# Patient Record
Sex: Female | Born: 1960 | Race: White | Hispanic: No | Marital: Married | Smoking: Never smoker
Health system: Southern US, Community
[De-identification: ages and names within clinical notes are randomized; demographics above are authoritative.]

## PROBLEM LIST (undated history)

## (undated) DIAGNOSIS — M1990 Unspecified osteoarthritis, unspecified site: Secondary | ICD-10-CM

## (undated) DIAGNOSIS — M069 Rheumatoid arthritis, unspecified: Secondary | ICD-10-CM

## (undated) DIAGNOSIS — I10 Essential (primary) hypertension: Secondary | ICD-10-CM

## (undated) HISTORY — DX: Rheumatoid arthritis, unspecified: M06.9

## (undated) HISTORY — DX: Essential (primary) hypertension: I10

## (undated) HISTORY — DX: Unspecified osteoarthritis, unspecified site: M19.90

## (undated) HISTORY — PX: ACHILLES TENDON REPAIR: SUR1153

---

## 2018-08-14 ENCOUNTER — Other Ambulatory Visit: Payer: Self-pay

## 2018-08-14 ENCOUNTER — Encounter: Payer: Self-pay | Admitting: Obstetrics and Gynecology

## 2018-08-14 ENCOUNTER — Other Ambulatory Visit (HOSPITAL_COMMUNITY)
Admission: RE | Admit: 2018-08-14 | Discharge: 2018-08-14 | Disposition: A | Discharge status: Home / Self Care | Payer: BLUE CROSS/BLUE SHIELD | Source: Ambulatory Visit | Attending: Obstetrics and Gynecology | Admitting: Obstetrics and Gynecology

## 2018-08-14 ENCOUNTER — Ambulatory Visit (INDEPENDENT_AMBULATORY_CARE_PROVIDER_SITE_OTHER): Payer: BLUE CROSS/BLUE SHIELD | Admitting: Obstetrics and Gynecology

## 2018-08-14 VITALS — BP 118/70 | HR 55 | Ht 66.75 in | Wt 177.0 lb

## 2018-08-14 DIAGNOSIS — Z87448 Personal history of other diseases of urinary system: Secondary | ICD-10-CM | POA: Diagnosis not present

## 2018-08-14 DIAGNOSIS — Z1151 Encounter for screening for human papillomavirus (HPV): Secondary | ICD-10-CM | POA: Insufficient documentation

## 2018-08-14 DIAGNOSIS — Z79899 Other long term (current) drug therapy: Secondary | ICD-10-CM | POA: Insufficient documentation

## 2018-08-14 DIAGNOSIS — I10 Essential (primary) hypertension: Secondary | ICD-10-CM | POA: Diagnosis not present

## 2018-08-14 DIAGNOSIS — N952 Postmenopausal atrophic vaginitis: Secondary | ICD-10-CM | POA: Diagnosis not present

## 2018-08-14 DIAGNOSIS — Z01419 Encounter for gynecological examination (general) (routine) without abnormal findings: Secondary | ICD-10-CM

## 2018-08-14 DIAGNOSIS — M069 Rheumatoid arthritis, unspecified: Secondary | ICD-10-CM | POA: Insufficient documentation

## 2018-08-14 LAB — POCT URINALYSIS DIPSTICK
BILIRUBIN UA: NEGATIVE
Glucose, UA: NEGATIVE
KETONES UA: NEGATIVE
NITRITE UA: NEGATIVE
Protein, UA: NEGATIVE
Urobilinogen, UA: 0.2 E.U./dL
pH, UA: 5 (ref 5.0–8.0)

## 2018-08-14 NOTE — Patient Instructions (Signed)

## 2018-08-14 NOTE — Progress Notes (Signed)
57 y.o. G57P2002 Married Caucasian female here for annual exam.    Pain with intercourse and vaginal dryness.  Tried vit E suppositories.  Tried vaginal estrogen cream but difficult to keep up. No prior HRT.  Mild hot flashes.   Has elevated cholesterol from [place].  Has RA.  Hx microscopic hematuria.   Moved to [place] one month ago.  Patient is a Engineer, civil (consulting).  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  Works in Insurance account manager.  Works from home.  Last child off to college now. Husband works for [employer].   Urine dip - trace leukocysts, trace blood.  PCP:   None  Patient's last menstrual period was 06/09/2015 (approximate).     Period Cycle (Days): (post menopausal)     Sexually active: Yes.    The current method of family planning is post menopausal status.    Exercising: Yes.    walking Smoker:  no  Health Maintenance: Pap:  2017 History of abnormal Pap:  no MMG:  2017 Colonoscopy:  Age 30, normal per patient BMD:   Age 73  Result  Normal per patient TDaP:  3 years ago Gardasil:   no HIV: unknown Hep C: unknown Screening Labs:  Will schedule with PCP Hb today: n/a, Urine today:    reports that she has never smoked. She has never used smokeless tobacco. She reports that she drinks alcohol. She reports that she has current or past drug history.  Past Medical History:  Diagnosis Date  . Hypertension   . Rheumatoid arthritis (HCC)    Past Surgical History: Achilles tendon debridement and bone spur removal.   Current Outpatient Medications  Medication Sig Dispense Refill  . carvedilol (COREG) 25 MG tablet Take 25 mg by mouth 2 (two) times daily.    . Coenzyme Q10 (CO Q 10 PO) Take by mouth daily.    . Ginkgo Biloba (GINKOBA PO) Take by mouth daily.    Marland Kitchen ibuprofen (ADVIL,MOTRIN) 200 MG tablet Take 200 mg by mouth as needed.    . Multiple Vitamins-Minerals (MULTIVITAMIN PO) Take by mouth daily.    Marland Kitchen olmesartan (BENICAR) 40 MG tablet Take 40 mg by mouth  daily.    . rosuvastatin (CRESTOR) 10 MG tablet Take 10 mg by mouth daily.    Marland Kitchen spironolactone (ALDACTONE) 25 MG tablet Take 25 mg by mouth daily.    . Tofacitinib Citrate (XELJANZ PO) Take 11 mg by mouth daily.    . TURMERIC PO Take by mouth daily.     No current facility-administered medications for this visit.     Family History  Problem Relation Age of Onset  . Thyroid disease Mother   . Glaucoma Father     Review of Systems  Constitutional: Negative.   HENT: Negative.   Eyes: Negative.   Respiratory: Negative.   Cardiovascular: Negative.   Gastrointestinal: Negative.   Endocrine: Negative.   Genitourinary:       Painful intercourse  Musculoskeletal: Negative.   Skin: Negative.   Allergic/Immunologic: Negative.   Neurological: Negative.   Hematological: Negative.   Psychiatric/Behavioral: Negative.   All other systems reviewed and are negative.   Exam:   BP 118/70   Pulse (!) 55   Ht 5' 6.75" (1.695 m)   Wt 177 lb (80.3 kg)   LMP 06/09/2015 (Approximate)   BMI 27.93 kg/m     General appearance: alert, cooperative and appears stated age Head: Normocephalic, without obvious abnormality, atraumatic Neck: no adenopathy, supple, symmetrical,  trachea midline and thyroid normal to inspection and palpation Lungs: clear to auscultation bilaterally Breasts: normal appearance, no masses or tenderness, No nipple retraction or dimpling, No nipple discharge or bleeding, No axillary or supraclavicular adenopathy Heart: regular rate and rhythm Abdomen: soft, non-tender; no masses, no organomegaly Extremities: extremities normal, atraumatic, no cyanosis or edema Skin: Skin color, texture, turgor normal. No rashes or lesions Lymph nodes: Cervical, supraclavicular, and axillary nodes normal. No abnormal inguinal nodes palpated Neurologic: Grossly normal  Pelvic: External genitalia:  no lesions              Urethra:  normal appearing urethra with no masses, tenderness or  lesions              Bartholins and Skenes: normal                 Vagina: normal appearing vagina with normal color and discharge, no lesions              Cervix: no lesions              Pap taken: Yes.   Bimanual Exam:  Uterus:  normal size, contour, position, consistency, mobility, non-tender              Adnexa: no mass, fullness, tenderness              Rectal exam: Yes.  .  Confirms.              Anus:  normal sphincter tone, no lesions  Chaperone was present for exam.  Assessment:   Well woman visit with normal exam. Vaginal atrophy. RA.  HTN.  Hx microscopic hematuria.   Plan: Mammogram screening.  She will schedule. Recommended self breast awareness. Pap and HR HPV as above. Guidelines for Calcium, Vitamin D, regular exercise program including cardiovascular and weight bearing exercise. Urine dip.  Estring Rx after mammogram back and normal.  I discussed potential effect on breast cancer.  List of PCPs to patient.  Labs with PCP. Follow up annually and prn.    After visit summary provided.

## 2018-08-17 ENCOUNTER — Other Ambulatory Visit: Payer: Self-pay | Admitting: Obstetrics and Gynecology

## 2018-08-17 DIAGNOSIS — Z1231 Encounter for screening mammogram for malignant neoplasm of breast: Secondary | ICD-10-CM

## 2018-08-18 LAB — CYTOLOGY - PAP
Diagnosis: NEGATIVE
HPV: NOT DETECTED

## 2018-08-21 ENCOUNTER — Telehealth: Payer: Self-pay

## 2018-08-21 NOTE — Telephone Encounter (Signed)
-----   Message from Patton Salles, MD sent at 08/20/2018 10:31 PM EDT ----- Pap and HR HPV negative.  Pap recall - 02.

## 2018-08-21 NOTE — Telephone Encounter (Signed)
Left detailed message for patient about results. 

## 2018-09-10 ENCOUNTER — Encounter: Payer: Self-pay | Admitting: Rheumatology

## 2018-09-10 ENCOUNTER — Ambulatory Visit (INDEPENDENT_AMBULATORY_CARE_PROVIDER_SITE_OTHER): Payer: Self-pay

## 2018-09-10 ENCOUNTER — Telehealth: Payer: Self-pay | Admitting: Pharmacy Technician

## 2018-09-10 ENCOUNTER — Ambulatory Visit (INDEPENDENT_AMBULATORY_CARE_PROVIDER_SITE_OTHER): Payer: BLUE CROSS/BLUE SHIELD | Admitting: Rheumatology

## 2018-09-10 VITALS — BP 128/84 | HR 54 | Resp 14 | Ht 66.5 in | Wt 180.0 lb

## 2018-09-10 DIAGNOSIS — Z79899 Other long term (current) drug therapy: Secondary | ICD-10-CM | POA: Diagnosis not present

## 2018-09-10 DIAGNOSIS — M0600 Rheumatoid arthritis without rheumatoid factor, unspecified site: Secondary | ICD-10-CM | POA: Diagnosis not present

## 2018-09-10 DIAGNOSIS — M25552 Pain in left hip: Secondary | ICD-10-CM

## 2018-09-10 DIAGNOSIS — M79671 Pain in right foot: Secondary | ICD-10-CM | POA: Diagnosis not present

## 2018-09-10 DIAGNOSIS — M79642 Pain in left hand: Secondary | ICD-10-CM | POA: Diagnosis not present

## 2018-09-10 DIAGNOSIS — G8929 Other chronic pain: Secondary | ICD-10-CM

## 2018-09-10 DIAGNOSIS — M25572 Pain in left ankle and joints of left foot: Secondary | ICD-10-CM

## 2018-09-10 DIAGNOSIS — R5383 Other fatigue: Secondary | ICD-10-CM | POA: Diagnosis not present

## 2018-09-10 DIAGNOSIS — M25571 Pain in right ankle and joints of right foot: Secondary | ICD-10-CM

## 2018-09-10 DIAGNOSIS — M25562 Pain in left knee: Secondary | ICD-10-CM

## 2018-09-10 DIAGNOSIS — I499 Cardiac arrhythmia, unspecified: Secondary | ICD-10-CM

## 2018-09-10 DIAGNOSIS — M25551 Pain in right hip: Secondary | ICD-10-CM

## 2018-09-10 DIAGNOSIS — M25561 Pain in right knee: Secondary | ICD-10-CM | POA: Diagnosis not present

## 2018-09-10 DIAGNOSIS — M79672 Pain in left foot: Secondary | ICD-10-CM

## 2018-09-10 DIAGNOSIS — M5136 Other intervertebral disc degeneration, lumbar region: Secondary | ICD-10-CM

## 2018-09-10 DIAGNOSIS — Z8679 Personal history of other diseases of the circulatory system: Secondary | ICD-10-CM

## 2018-09-10 DIAGNOSIS — M79641 Pain in right hand: Secondary | ICD-10-CM | POA: Diagnosis not present

## 2018-09-10 NOTE — Telephone Encounter (Signed)
Received a Prior Authorization request from [employer] for Xeljanz 11mg . Authorization has been submitted to patient's insurance via Cover My Meds. Will update once we receive a response.  Per insurance, patient's PA expired on 09/01/18. Her last refill was for 90 supply in July.  10:01 AM August, CPhT

## 2018-09-10 NOTE — Telephone Encounter (Signed)
Received notification from [facility] regarding a prior authorization for Xeljanz XR 11mg . Authorization has been APPROVED from 08/11/18 to 09/10/19.   Patient is approved to receive 90 day supplies.  Will send document to scan center.  Authorization # [id number] Phone # [phone]  Per plan, Patient must use [facility]. Please send in new prescription for 90 day supply.  10:17 AM [phone], CPhT

## 2018-09-10 NOTE — Progress Notes (Signed)
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   History of Present Illness: Tonya Strickland is a 57 y.o. female seen in consultation per request of Dr. Jinger Neighbors from [place].  According to patient her symptoms are started in her 62s with the intermittent knee joint pain, pain in her hands shoulders, hip joints, knee joints and feet.  She states she had several visits to orthopedic surgeons and no conclusion was made.  In 2015 she developed a painful Achilles tendon at the time she was seen by an orthopedic surgeon who felt that she had warmth over her ankle joint.  And referred her to Dr. Ronnald Ramp.  Dr. Ronnald Ramp did lab work and found that her ANA was positive advised to 160 per patient.  She states her rheumatoid factor was negative.  She was diagnosed with seronegative rheumatoid arthritis and was a started on Enbrel.  She states that Enbrel did not give much much relief.  After 3 months she was switched to Humira which she tried for 3 months without much relief.  She was switched to methotrexate which caused a lot of side effects including nausea high blood pressure and increased swelling.  Morrie Sheldon was added about 1-1/2-year ago.  She states after adding Morrie Sheldon her symptoms started getting better with less frequent flares.  She states she still has some ongoing pain and discomfort especially in her right knee joint.  Methotrexate was discontinued.  She is very stiff in the morning and walking helps.  She states if she takes Motrin.  In the spring 2019 she started experiencing hip pain and left SI joint pain.  She states she went to her physical therapist which helped to some extent.  At that time Dr. Ronnald Ramp obtain some x-rays and MRI.  The MRI  of the lumbar spine showed degenerative disc disease and mild central canal stenosis.  Activities of Daily Living:  Patient reports morning stiffness for 45 minutes.   Patient Reports nocturnal pain.  Difficulty dressing/grooming: Denies Difficulty climbing stairs: Reports Difficulty getting out of chair: Denies Difficulty using hands for taps, buttons, cutlery, and/or writing: Denies  Review of Systems  Constitutional: Negative for fatigue, night sweats, weight gain and weight loss.  HENT: Negative for mouth sores, trouble swallowing, trouble swallowing, mouth dryness and nose dryness.   Eyes: Negative for pain, redness, visual disturbance and dryness.  Respiratory: Negative for cough, shortness of breath and difficulty breathing.   Cardiovascular: Negative for chest pain, palpitations, hypertension, irregular heartbeat and swelling in legs/feet.  Gastrointestinal: Positive for constipation. Negative for blood in stool and diarrhea.  Endocrine: Negative for increased urination.  Genitourinary: Negative for vaginal dryness.  Musculoskeletal: Positive for arthralgias, joint pain, joint swelling and morning stiffness. Negative for myalgias, muscle weakness, muscle tenderness and myalgias.  Skin: Negative for color change, rash, hair loss, skin tightness, ulcers and sensitivity to sunlight.  Allergic/Immunologic: Negative for susceptible to infections.  Neurological: Negative for dizziness, memory loss, night sweats and weakness.  Hematological: Negative for swollen glands.  Psychiatric/Behavioral: Positive for sleep disturbance. Negative for depressed mood. The patient is not nervous/anxious.  PMFS History:  Patient Active Problem List   Diagnosis Date Noted  . Seronegative rheumatoid arthritis ([place]) 09/10/2018  . Irregular heart beat 09/10/2018  . History of hypertension 09/10/2018  . DDD (degenerative disc disease), lumbar 09/10/2018    Past Medical History:  Diagnosis Date  .  Hypertension   . Rheumatoid arthritis ([place])     Family History  Problem Relation Age of Onset  . Thyroid disease Mother        thyroid cancer   . Glaucoma Father   . Hypertension Father   . Neuropathy Father   . Arthritis Father   . Arthritis Brother   . Hypertension Brother   . Healthy Son   . Healthy Son    Past Surgical History:  Procedure Laterality Date  . ACHILLES TENDON REPAIR    . CESAREAN SECTION     Social History   Social History Narrative  . Not on file    Objective: Vital Signs: BP 128/84 (BP Location: Right Arm, Patient Position: Sitting, Cuff Size: Normal)   Pulse (!) 54   Resp 14   Ht 5' 6.5" (1.689 m)   Wt 180 lb (81.6 kg)   LMP 06/09/2015 (Approximate)   BMI 28.62 kg/m    Physical Exam  Constitutional: She is oriented to person, place, and time. She appears well-developed and well-nourished.  HENT:  Head: Normocephalic and atraumatic.  Eyes: Conjunctivae and EOM are normal.  Neck: Normal range of motion.  Cardiovascular: Normal rate, regular rhythm, normal heart sounds and intact distal pulses.  Pulmonary/Chest: Effort normal and breath sounds normal.  Abdominal: Soft. Bowel sounds are normal.  Lymphadenopathy:    She has no cervical adenopathy.  Neurological: She is alert and oriented to person, place, and time.  Skin: Skin is warm and dry. Capillary refill takes less than 2 seconds.  Psychiatric: She has a normal mood and affect. Her behavior is normal.  Nursing note and vitals reviewed.    Musculoskeletal Exam: Patient is stiffness and discomfort with range of motion of her cervical spine although she had good range of motion.  She is some discomfort range of motion of lumbar spine.  No SI joint tenderness was noted.  Shoulder joints were in good range of motion.  She has 10 degrees contractures in bilateral elbows without synovitis.  Her right wrist joint extension was 28 degrees and flexion 60 degrees.  Left wrist joint extension was 50  degrees and flexion 60 degrees.  She has synovial thickening over bilateral wrist joints, bilateral second and third MCP joints and right fifth MCP joint.  No synovitis was noted.  She had painful range of motion of bilateral hip joints.  She has thickening of bilateral knee joints and ankle joints without any synovitis.  She did not have any synovial thickening or synovitis of her MTP joints.  CDAI Exam: CDAI Score: 0.6  Patient Global Assessment: 3 (mm); Provider Global Assessment: 3 (mm) Swollen: 0 ; Tender: 0  Joint Exam   Not documented   There is currently no information documented on the homunculus. Go to the Rheumatology activity and complete the homunculus joint exam.  Investigation: Findings:  June 2019 CMP normal, CBC normal, anti-CCP negative, RF negative, ESR 2, CRP less than 0.2, TB Gold negative, fasting lipid panel LDL 58   Imaging: Xr Hips Bilat W Or W/o Pelvis 3-4 Views  Result Date: 09/10/2018 No hip joint narrowing was noted.  Some sclerosis was noted.  No SI joint narrowing was  noted.  Mild left SI joint to sclerosis was noted..  Some spurring was noted over the greater trochanter.  Xr Ankle 2 Views Left  Result Date: 09/10/2018 No tibiotalar joint space narrowing was noted.  Subtalar joint space narrowing was noted.  Posterior calcaneal spur was noted.  Xr Ankle 2 Views Right  Result Date: 09/10/2018 No tibiotalar joint space narrowing was noted.  Subtalar joint space narrowing was noted.  Some dorsal spurring was noted.  Xr Foot 2 Views Left  Result Date: 09/10/2018 First MTP, all PIP and DIP joint space narrowing was noted.  No erosive changes were noted.  No intertarsal joint space narrowing was noted. Impression: These findings are consistent with osteoarthritis of the foot.  Xr Foot 2 Views Right  Result Date: 09/10/2018 First MTP, all PIP and DIP joint space narrowing was noted.  No erosive changes were noted.  No intertarsal joint space narrowing was  noted. Impression: These findings are consistent with osteoarthritis of the foot.  Xr Hand 2 View Left  Result Date: 09/10/2018 Juxta-articular osteopenia was noted.  PIP narrowing was noted.  Mild intercarpal joint space narrowing was noted.  No erosive changes were noted. Impression: These findings are consistent with rheumatoid arthritis and osteoarthritis overlap.  Xr Hand 2 View Right  Result Date: 09/10/2018 Juxta-articular osteopenia was noted.  PIP and DIP narrowing was noted.  No significant MCP joint narrowing or erosive changes were noted.  Mild intercarpal joint space narrowing was noted. Impression: These findings are consistent with rheumatoid arthritis and osteoarthritis overlap.  Xr Knee 3 View Left  Result Date: 09/10/2018 Mild to moderate medial compartment narrowing was noted.  Intercondylar osteophytes were noted.  Severe patellofemoral narrowing was noted. 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  Xr Knee 3 View Right  Result Date: 09/10/2018 Mild to moderate medial compartment narrowing was noted.  Intercondylar osteophytes were noted.  Severe patellofemoral narrowing was noted. 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   Recent Labs: No results found for: WBC, HGB, PLT, NA, K, CL, CO2, GLUCOSE, BUN, CREATININE, BILITOT, ALKPHOS, AST, ALT, PROT, ALBUMIN, CALCIUM, GFRAA, QFTBGOLD, QFTBGOLDPLUS  Speciality Comments: No specialty comments available.  Procedures:  No procedures performed Allergies: Hctz [hydrochlorothiazide]   Assessment / Plan:     Visit Diagnoses: Seronegative rheumatoid arthritis ([place]) -patient started having symptoms in her 5s off inflammatory arthritis which was not treated for many years.  She is fairly well controlled on Morrie Sheldon now.  She has significant synovial thickening in multiple joints and  contractures but no active synovitis noted on examination today.  I will obtain following labs today.  I will also schedule ultrasound of her bilateral hands to look for active synovitis.- Plan: Urinalysis, Routine w reflex microscopic, 14-3-3 eta Protein.  Detailed counseling on Morrie Sheldon was provided.  Need for getting Shingrix vaccine was emphasized.  Handout was given and consent was taken today.  High risk medication use - xeljanz 11 mg p.o. daily, (MTX-discontinued due to side effects, previously tried Humira and Enbrel 2016)  - Plan: CBC with Differential/Platelet, COMPLETE METABOLIC PANEL WITH GFR, Hepatitis B core antibody, IgM, Hepatitis B surface antigen, Hepatitis C antibody, HIV Antibody (routine testing w rflx), Serum protein electrophoresis with reflex, IgG, IgA, IgM.  We will monitor CBC and CMP every 3 months, lipid panel on yearly basis and to be called on yearly basis.  Pain in both hands -she has synovial  thickening over bilateral wrist joints and several of her MCPs as described above.  She also has contractures in her bilateral elbows.  Plan: XR Hand 2 View Right, XR Hand 2 View Left.  No erosive changes were noted.  Acute pain of both hips -she had painful range of motion of bilateral hip joints.  Plan: XR HIPS BILAT W OR W/O PELVIS 3-4 VIEWS, hip joint x-rays were unremarkable.  Chronic pain of both knees -she has thickening of bilateral knee joints with limited extension.  Plan: XR KNEE 3 VIEW RIGHT, XR KNEE 3 VIEW LEFT.  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  Pain in both feet -she has thickening of bilateral ankle joints.  Tenderness across her MTPs.  Plan: XR Foot 2 Views Right, XR Foot 2 Views Left  Chronic pain of both ankles - Plan: XR Ankle 2 Views Right, XR Ankle 2 Views Left.  Mild subtalar joint space narrowing was noted. DDD (degenerative disc disease), lumbar -I reviewed MRI report of her lumbar spine.  L5-S1 degenerative  changes and mild central canal stenosis.  Irregular heart beat  History of hypertension  Other fatigue - Plan: CK, TSH   Orders: Orders Placed This Encounter  Procedures  . XR Hand 2 View Right  . XR Hand 2 View Left  . XR KNEE 3 VIEW RIGHT  . XR KNEE 3 VIEW LEFT  . XR Foot 2 Views Right  . XR Foot 2 Views Left  . XR Ankle 2 Views Right  . XR Ankle 2 Views Left  . XR HIPS BILAT W OR W/O PELVIS 3-4 VIEWS  . CBC with Differential/Platelet  . COMPLETE METABOLIC PANEL WITH GFR  . Urinalysis, Routine w reflex microscopic  . CK  . TSH  . 14-3-3 eta Protein  . Hepatitis B core antibody, IgM  . Hepatitis B surface antigen  . Hepatitis C antibody  . HIV Antibody (routine testing w rflx)  . Serum protein electrophoresis with reflex  . IgG, IgA, IgM   No orders of the defined types were placed in this encounter.   Face-to-face time spent with patient was 60 minutes. Greater than 50% of time was spent in counseling and coordination of care.  Follow-Up Instructions: Return for Rheumatoid arthritis.   Bo Merino, MD  Note - This record has been created using Editor, commissioning.  Chart creation errors have been sought, but may not always  have been located. Such creation errors do not reflect on  the standard of medical care.

## 2018-09-10 NOTE — Progress Notes (Signed)
Pharmacy Note  Subjective: Patient presents today to the [facility] to see Dr. Corliss Skains.  Patient seen by the pharmacist for counseling on Xeljanz.  Patient is relocating to the area and was on Xeljanz per her previous rheumatologist.  Objective: TB test: pending 09/10/18 Hepatitis: pending 09/10/18 Lipid panel: pending 09/10/18  CMP: pending  CBC: pending  Does patient have history of diverticulitis?  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  Counseled patient on purpose, proper use, and adverse effects of Xeljanz.   Reviewed the most common adverse effects including infection, diarrhea, headaches.  Also reviewed rare adverse effects such as bowel injury and the need to contact us if she develops stomach pain during treatment.  Reviewed with patient that there is the possibility of an increased risk of malignancy but it is not well understood if this increased risk is due to the medication or the disease state.  Counseled patient to avoid live vaccines while on [place].  Provided patient with medication education material and answered all questions.  Patient consented to [place].  Will upload Harriette Ohara into patient's chart.     Verlin Fester, PharmD, [facility] Rheumatology Clinical Pharmacist  09/10/2018 9:23 AM

## 2018-09-10 NOTE — Patient Instructions (Addendum)
Vaccines You are taking a medication(s) that can suppress your immune system.  The following immunizations are recommended: . Flu annually . Pneumonia . Shingrix . Hepatitis B  Please check with your PCP to make sure you are up to date.  Tofacitinib tablets What is this medicine? TOFACITINIB (TOE fa SYE ti nib) is a medicine that works on the immune system. This medicine is used to treat rheumatoid arthritis and psoriatic arthritis. This medicine may be used for other purposes; ask your health care provider or pharmacist if you have questions. COMMON BRAND NAME(S): Harriette Ohara What should I tell my health care provider before I take this medicine? They need to know if you have any of these conditions: -cancer -diabetes -high cholesterol -immune system problems -infection (especially a virus infection such as chickenpox, cold sores, or herpes) -kidney disease -liver disease -low blood counts, like low white cell, platelet, or red cell counts -stomach or intestine problems -an unusual or allergic reaction to tofacitinib, other medicines, foods, dyes, or preservatives -pregnant or trying to get pregnant -breast-feeding How should I use this medicine? Take this medicine by mouth with a glass of water. Follow the directions on the prescription label. You can take it with or without food. If it upsets your stomach, take it with food. A special MedGuide will be given to you by the pharmacist with each prescription and refill. Be sure to read this information carefully each time. Talk to your pediatrician regarding the use of this medicine in children. Special care may be needed. Overdosage: If you think you have taken too much of this medicine contact a poison control center or emergency room at once. NOTE: This medicine is only for you. Do not share this medicine with others. What if I miss a dose? If you miss a dose, take it as soon as you can. If it is almost time for your next dose, take  only that dose. Do not take double or extra doses. What may interact with this medicine? Do not take this medicine with any of the following medications: -azathioprine, cyclosporine, or other immunosuppressive drugs -biologic medicines for arthritis such as abatacept, adalimumab, anakinra, certolizumab, etanercept, golimumab, infliximab, rituximab, secukinumab, tocilizumab, ustekinumab -live vaccines This medicine may also interact with the following medications: -antiviral medicines for hepatitis, HIV or AIDS -certain medicines for fungal infections like fluconazole, itraconazole, ketoconazole, voriconazole -certain medicines for seizures like carbamazepine, phenobarbital, phenytoin -rifampin -supplements, such as St. John's wort This list may not describe all possible interactions. Give your health care provider a list of all the medicines, herbs, non-prescription drugs, or dietary supplements you use. Also tell them if you smoke, drink alcohol, or use illegal drugs. Some items may interact with your medicine. What should I watch for while using this medicine? Tell your doctor or healthcare professional if your symptoms do not start to get better or if they get worse. Avoid taking products that contain aspirin, acetaminophen, ibuprofen, naproxen, or ketoprofen unless instructed by your doctor. These medicines may hide a fever. Call your doctor or health care professional for advice if you get a fever, chills or sore throat, or other symptoms of a cold or flu. Do not treat yourself. This drug decreases your body's ability to fight infections. Try to avoid being around people who are sick. What side effects may I notice from receiving this medicine? Side effects that you should report to your doctor or health care professional as soon as possible: -allergic reactions like skin rash, itching or  hives, swelling of the face, lips, or tongue -breathing problems -dizziness -signs of infection -  fever or chills, cough, sore throat, pain or trouble passing urine -signs and symptoms of liver injury like dark yellow or brown urine; general ill feeling or flu-like symptoms; light-colored stools; loss of appetite; nausea; right upper belly pain; unusually weak or tired; yellowing of the eyes or skin -stomach pain or a sudden change in bowel habits -unusually weak or tired Side effects that usually do not require medical attention (report to your doctor or health care professional if they continue or are bothersome): -diarrhea -headache -muscle aches -runny nose -sinus trouble This list may not describe all possible side effects. Call your doctor for medical advice about side effects. You may report side effects to FDA at 1-800-FDA-1088. Where should I keep my medicine? Keep out of the reach of children. Store between 20 and 25 degrees C (68 and 77 degrees F). Throw away any unused medicine after the expiration date. NOTE: This sheet is a summary. It may not cover all possible information. If you have questions about this medicine, talk to your doctor, pharmacist, or health care provider.  2018 Elsevier/Gold Standard (2016-12-13 11:43:53) Standing Labs We placed an order today for your standing lab work.    Please come back and get your standing labs in January and every 3 months  We have open lab Monday through Friday from 8:30-11:30 AM and 1:30-4:00 PM  at the office of Dr. Pollyann Savoy.   You may experience shorter wait times on Monday and Friday afternoons. The office is located at [address], [address], [place], [place] [address] No appointment is necessary.   Labs are drawn by [facility].  You may receive a bill from [facility] for your lab work. If you have any questions regarding directions or hours of operation,  please call [phone].   Just as a reminder please drink plenty of water prior to coming for your lab work. Thanks!

## 2018-09-11 ENCOUNTER — Ambulatory Visit: Payer: Self-pay

## 2018-09-15 LAB — CBC WITH DIFFERENTIAL/PLATELET
BASOS PCT: 0.3 %
Basophils Absolute: 19 cells/uL (ref 0–200)
Eosinophils Absolute: 68 cells/uL (ref 15–500)
Eosinophils Relative: 1.1 %
HCT: 36.7 % (ref 35.0–45.0)
Hemoglobin: 12.1 g/dL (ref 11.7–15.5)
Lymphs Abs: 1755 cells/uL (ref 850–3900)
MCH: 30.1 pg (ref 27.0–33.0)
MCHC: 33 g/dL (ref 32.0–36.0)
MCV: 91.3 fL (ref 80.0–100.0)
MONOS PCT: 5.8 %
MPV: 10.7 fL (ref 7.5–12.5)
NEUTROS PCT: 64.5 %
Neutro Abs: 3999 cells/uL (ref 1500–7800)
Platelets: 230 10*3/uL (ref 140–400)
RBC: 4.02 10*6/uL (ref 3.80–5.10)
RDW: 12.8 % (ref 11.0–15.0)
TOTAL LYMPHOCYTE: 28.3 %
WBC: 6.2 10*3/uL (ref 3.8–10.8)
WBCMIX: 360 {cells}/uL (ref 200–950)

## 2018-09-15 LAB — URINALYSIS, ROUTINE W REFLEX MICROSCOPIC
BILIRUBIN URINE: NEGATIVE
Bacteria, UA: NONE SEEN /HPF
Glucose, UA: NEGATIVE
Hyaline Cast: NONE SEEN /LPF
Ketones, ur: NEGATIVE
Leukocytes, UA: NEGATIVE
Nitrite: NEGATIVE
Protein, ur: NEGATIVE
RBC / HPF: NONE SEEN /HPF (ref 0–2)
SPECIFIC GRAVITY, URINE: 1.012 (ref 1.001–1.03)
Squamous Epithelial / LPF: NONE SEEN /HPF (ref <=5)
WBC, UA: NONE SEEN /HPF (ref 0–5)
pH: 6 (ref 5.0–8.0)

## 2018-09-15 LAB — COMPLETE METABOLIC PANEL WITH GFR
AG RATIO: 2 (calc) (ref 1.0–2.5)
ALKALINE PHOSPHATASE (APISO): 35 U/L (ref 33–130)
ALT: 18 U/L (ref 6–29)
AST: 17 U/L (ref 10–35)
Albumin: 4.7 g/dL (ref 3.6–5.1)
BUN: 16 mg/dL (ref 7–25)
CALCIUM: 9.6 mg/dL (ref 8.6–10.4)
CO2: 24 mmol/L (ref 20–32)
Chloride: 102 mmol/L (ref 98–110)
Creat: 0.8 mg/dL (ref 0.50–1.05)
GFR, EST NON AFRICAN AMERICAN: 83 mL/min/{1.73_m2} (ref >=60)
GFR, Est African American: 96 mL/min/{1.73_m2} (ref >=60)
GLOBULIN: 2.3 g/dL (ref 1.9–3.7)
Glucose, Bld: 87 mg/dL (ref 65–99)
POTASSIUM: 4.4 mmol/L (ref 3.5–5.3)
SODIUM: 138 mmol/L (ref 135–146)
Total Bilirubin: 0.6 mg/dL (ref 0.2–1.2)
Total Protein: 7 g/dL (ref 6.1–8.1)

## 2018-09-15 LAB — TSH: TSH: 0.84 m[IU]/L

## 2018-09-15 LAB — HEPATITIS B CORE ANTIBODY, IGM: HEP B C IGM: NONREACTIVE

## 2018-09-15 LAB — IGG, IGA, IGM
IGG (IMMUNOGLOBIN G), SERUM: 840 mg/dL (ref 600–1640)
IGM, SERUM: 116 mg/dL (ref 50–300)
IMMUNOGLOBULIN A: 85 mg/dL (ref 47–310)

## 2018-09-15 LAB — HIV ANTIBODY (ROUTINE TESTING W REFLEX): HIV 1&2 Ab, 4th Generation: NONREACTIVE

## 2018-09-15 LAB — PROTEIN ELECTROPHORESIS, SERUM, WITH REFLEX
ALPHA 2: 0.7 g/dL (ref 0.5–0.9)
Albumin ELP: 4.5 g/dL (ref 3.8–4.8)
Alpha 1: 0.3 g/dL (ref 0.2–0.3)
BETA 2: 0.3 g/dL (ref 0.2–0.5)
BETA GLOBULIN: 0.5 g/dL (ref 0.4–0.6)
Gamma Globulin: 0.8 g/dL (ref 0.8–1.7)
Total Protein: 7 g/dL (ref 6.1–8.1)

## 2018-09-15 LAB — HEPATITIS C ANTIBODY
Hepatitis C Ab: NONREACTIVE
SIGNAL TO CUT-OFF: 0.01 (ref <1.00)

## 2018-09-15 LAB — IFE INTERPRETATION: IMMUNOFIX ELECTR INT: NOT DETECTED

## 2018-09-15 LAB — 14-3-3 ETA PROTEIN: 14-3-3 eta Protein: <0.2 ng/mL (ref <0.2)

## 2018-09-15 LAB — HEPATITIS B SURFACE ANTIGEN: HEP B S AG: NONREACTIVE

## 2018-09-15 LAB — CK: CK TOTAL: 51 U/L (ref 29–143)

## 2018-09-16 NOTE — Progress Notes (Signed)
WNLs

## 2018-09-21 ENCOUNTER — Other Ambulatory Visit: Payer: Self-pay | Admitting: Pharmacist

## 2018-09-21 ENCOUNTER — Other Ambulatory Visit: Payer: Self-pay | Admitting: Rheumatology

## 2018-09-21 MED ORDER — TOFACITINIB CITRATE ER 11 MG PO TB24: 11.0000 mg | ORAL_TABLET | Freq: Every day | ORAL | 0 refills | Status: DC

## 2018-09-21 NOTE — Telephone Encounter (Signed)
Prescription sent to the pharmacy this morning by [employer], Pharm D. Contacted patient to advise.

## 2018-09-21 NOTE — Progress Notes (Signed)
Labs returned negative or WNL.  Prescription sent to [facility] for Xeljanz XR.

## 2018-09-29 DIAGNOSIS — Z1389 Encounter for screening for other disorder: Secondary | ICD-10-CM | POA: Diagnosis not present

## 2018-09-29 DIAGNOSIS — E7849 Other hyperlipidemia: Secondary | ICD-10-CM | POA: Diagnosis not present

## 2018-09-29 DIAGNOSIS — M5126 Other intervertebral disc displacement, lumbar region: Secondary | ICD-10-CM | POA: Diagnosis not present

## 2018-09-29 DIAGNOSIS — I10 Essential (primary) hypertension: Secondary | ICD-10-CM | POA: Diagnosis not present

## 2018-09-29 DIAGNOSIS — M069 Rheumatoid arthritis, unspecified: Secondary | ICD-10-CM | POA: Diagnosis not present

## 2018-09-30 ENCOUNTER — Ambulatory Visit (INDEPENDENT_AMBULATORY_CARE_PROVIDER_SITE_OTHER): Payer: Self-pay

## 2018-09-30 ENCOUNTER — Ambulatory Visit (INDEPENDENT_AMBULATORY_CARE_PROVIDER_SITE_OTHER): Payer: BLUE CROSS/BLUE SHIELD | Admitting: Rheumatology

## 2018-09-30 DIAGNOSIS — M79642 Pain in left hand: Secondary | ICD-10-CM

## 2018-09-30 DIAGNOSIS — M79641 Pain in right hand: Secondary | ICD-10-CM | POA: Diagnosis not present

## 2018-10-09 DIAGNOSIS — M170 Bilateral primary osteoarthritis of knee: Secondary | ICD-10-CM | POA: Insufficient documentation

## 2018-10-09 DIAGNOSIS — M24522 Contracture, left elbow: Secondary | ICD-10-CM | POA: Insufficient documentation

## 2018-10-09 DIAGNOSIS — M24521 Contracture, right elbow: Secondary | ICD-10-CM | POA: Insufficient documentation

## 2018-10-09 NOTE — Progress Notes (Signed)
Office Visit Note  Patient: Tonya Strickland             Date of Birth: 07-Aug-1961           MRN: [id number]             PCP: Patient, No Pcp Per Referring: No ref. provider found Visit Date: 10/20/2018 Occupation: _0 @  Subjective:  Lower back pain and hip pain.   History of Present Illness: Tonya Strickland is a 57 y.o. female with history of seronegative rheumatoid arthritis, osteoarthritis and degenerative disease of lumbar spine.  She states she was diagnosed with L5 bulging disc.  She has been going for physical therapy but the pain persists.  She describes pain over bilateral trochanteric bursa.  She describes nocturnal pain while she sleeps on her side.  She denies any joint swelling.  She continues to have some discomfort in her knee joints.  Activities of Daily Living:  Patient reports morning stiffness for 1-2 hours.   Patient Reports nocturnal pain.  Difficulty dressing/grooming: Denies Difficulty climbing stairs: Denies Difficulty getting out of chair: Denies Difficulty using hands for taps, buttons, cutlery, and/or writing: Denies  Review of Systems  Constitutional: Negative for fatigue.  HENT: Negative for mouth dryness.   Eyes: Negative for dryness.  Respiratory: Negative for cough and shortness of breath.   Cardiovascular: Negative for chest pain, palpitations, hypertension and swelling in legs/feet.  Gastrointestinal: Negative for blood in stool, constipation and diarrhea.  Genitourinary: Negative for difficulty urinating.  Musculoskeletal: Positive for arthralgias, joint pain, joint swelling, myalgias, muscle weakness, morning stiffness and myalgias. Negative for muscle tenderness.  Skin: Negative for rash, hair loss and sensitivity to sunlight.  Neurological: Negative for dizziness, numbness, headaches and weakness.  Psychiatric/Behavioral: Positive for sleep disturbance. Negative for depressed mood.    PMFS History:  Patient Active Problem List   Diagnosis  Date Noted  . Primary osteoarthritis of both knees 10/09/2018  . Contracture of joint of both elbows 10/09/2018  . Seronegative rheumatoid arthritis ([place]) 09/10/2018  . Irregular heart beat 09/10/2018  . History of hypertension 09/10/2018  . DDD (degenerative disc disease), lumbar 09/10/2018    Past Medical History:  Diagnosis Date  . Hypertension   . Rheumatoid arthritis ([place])     Family History  Problem Relation Age of Onset  . Thyroid disease Mother        thyroid cancer   . Glaucoma Father   . Hypertension Father   . Neuropathy Father   . Arthritis Father   . Arthritis Brother   . Hypertension Brother   . Healthy Son   . Healthy Son   . Breast cancer Neg Hx    Past Surgical History:  Procedure Laterality Date  . ACHILLES TENDON REPAIR    . CESAREAN SECTION     Social History   Social History Narrative  . Not on file    Objective: Vital Signs: BP (!) 87/57 (BP Location: Left Arm, Patient Position: Sitting, Cuff Size: Normal)   Pulse 63   Ht 5' 7.25" (1.708 m)   Wt 182 lb 12.8 oz (82.9 kg)   LMP 06/09/2015 (Approximate)   BMI 28.42 kg/m    Physical Exam  Constitutional: She is oriented to person, place, and time. She appears well-developed and well-nourished.  HENT:  Head: Normocephalic and atraumatic.  Eyes: Conjunctivae and EOM are normal.  Neck: Normal range of motion.  Cardiovascular: Normal rate, regular rhythm, normal heart sounds and intact distal pulses.  Pulmonary/Chest:  Effort normal and breath sounds normal.  Abdominal: Soft. Bowel sounds are normal.  Lymphadenopathy:    She has no cervical adenopathy.  Neurological: She is alert and oriented to person, place, and time.  Skin: Skin is warm and dry. Capillary refill takes less than 2 seconds.  Psychiatric: She has a normal mood and affect. Her behavior is normal.  Nursing note and vitals reviewed.    Musculoskeletal Exam: C-spine thoracic spine good range of motion.  She has some discomfort  range of motion of her lumbar spine.  Shoulder joints elbow joints: Limited range of motion.  She has DIP and PIP thickening.  No MCP synovitis was noted.  Hip joints knee joints ankles MTPs PIPs been good range of motion with no synovitis.  She has synovial thickening over bilateral ankle joints.  CDAI Exam: CDAI Score: 0.2  Patient Global Assessment: 1 (mm); Provider Global Assessment: 1 (mm) Swollen: 0 ; Tender: 0  Joint Exam   Not documented   There is currently no information documented on the homunculus. Go to the Rheumatology activity and complete the homunculus joint exam.  Investigation: No additional findings.  Imaging: [place] Extrem Up Bilat Comp  Result Date: 09/30/2018 Ultrasound examination of bilateral hands was performed per EULAR recommendations. Using 12 MHz transducer, grayscale and power Doppler bilateral second, third, and fifth MCP joints and bilateral wrist joints both dorsal and volar aspects were evaluated to look for synovitis or tenosynovitis. The findings were there was no synovitis or tenosynovitis on ultrasound examination.  Synovial thickening was noted in right third and left second MCP joint.  Right median nerve was 0.10 cm squares which was within normal limits and left median nerve was 0.09 cm squares which was within normal limits. Impression: Ultrasound examination did not show any active synovitis.  Bilateral median nerves were within normal limits.   Recent Labs: Lab Results  Component Value Date   WBC 6.2 09/10/2018   HGB 12.1 09/10/2018   PLT 230 09/10/2018   NA 138 09/10/2018   K 4.4 09/10/2018   CL 102 09/10/2018   CO2 24 09/10/2018   GLUCOSE 87 09/10/2018   BUN 16 09/10/2018   CREATININE 0.80 09/10/2018   BILITOT 0.6 09/10/2018   AST 17 09/10/2018   ALT 18 09/10/2018   PROT 7.0 09/10/2018   PROT 7.0 09/10/2018   CALCIUM 9.6 09/10/2018   GFRAA 96 09/10/2018  UA negative, IFE negative, HIV negative, hepatitis B negative, hepatitis C-,  immunoglobulins normal, CK 51, TSH normal, _0 eta negative  June  2019 CMP normal, CBC normal, anti-CCP negative, RF negative, ESR 2, CRP less than 0.2, TB Gold negative, fasting lipid panel LDL 58  Speciality Comments: Immunospuressant Baseline Lab Work: TB gold negative June 2019  Procedures:  No procedures performed Allergies: Hctz [hydrochlorothiazide]   Assessment / Plan:     Visit Diagnoses: Seronegative rheumatoid arthritis ([place]) - RF negative, anti-CCP negative, _1 eta negative ESR normal.  Positive synovial thickening.  History of inflammatory arthritis in the past.  The ultrasound examination done recently was negative for synovitis.  She has no clinical synovitis on examination today.  High risk medication use - Xeljanz 11 mg p.o. daily(MTX-discontinued due to side effects, previously tried Humira and Enbrel 2016).  Her labs are stable.  We will continue to monitor labs every 3 months.  Contracture of joint of both elbows-chronic  Primary osteoarthritis of both knees - Bilateral moderate osteoarthritis and severe chondromalacia patella.  She  continues to have some discomfort in the joints but is tolerable.  DDD (degenerative disc disease), lumbar-she is going for physical therapy.  Chronic pain of both ankles - Synovial thickening  History of hypertension  Irregular heart beat  Trochanteric bursitis of both hips -she has more discomfort in her left trochanteric bursa than right.  She is going for physical therapy for her back.  Have given her prescription so she can get physical therapy for trochanteric bursitis.  Plan: Ambulatory referral to Physical Therapy   Orders: Orders Placed This Encounter  Procedures  . Ambulatory referral to Physical Therapy   No orders of the defined types were placed in this encounter.   Face-to-face time spent with patient was 30 minutes. Greater than 50% of time was spent in counseling and coordination of care.  Follow-Up  Instructions: Return in about 5 months (around 03/21/2019) for Rheumatoid arthritis, Osteoarthritis.   Bo Merino, MD  Note - This record has been created using Editor, commissioning.  Chart creation errors have been sought, but may not always  have been located. Such creation errors do not reflect on  the standard of medical care.

## 2018-10-16 ENCOUNTER — Ambulatory Visit
Admission: RE | Admit: 2018-10-16 | Discharge: 2018-10-16 | Disposition: A | Discharge status: Home / Self Care | Payer: BLUE CROSS/BLUE SHIELD | Source: Ambulatory Visit | Attending: Obstetrics and Gynecology | Admitting: Obstetrics and Gynecology

## 2018-10-16 DIAGNOSIS — Z1231 Encounter for screening mammogram for malignant neoplasm of breast: Secondary | ICD-10-CM | POA: Diagnosis not present

## 2018-10-19 DIAGNOSIS — M79604 Pain in right leg: Secondary | ICD-10-CM | POA: Diagnosis not present

## 2018-10-19 DIAGNOSIS — M545 Low back pain: Secondary | ICD-10-CM | POA: Diagnosis not present

## 2018-10-19 DIAGNOSIS — M5416 Radiculopathy, lumbar region: Secondary | ICD-10-CM | POA: Diagnosis not present

## 2018-10-19 DIAGNOSIS — M79605 Pain in left leg: Secondary | ICD-10-CM | POA: Diagnosis not present

## 2018-10-20 ENCOUNTER — Encounter: Payer: Self-pay | Admitting: Rheumatology

## 2018-10-20 ENCOUNTER — Ambulatory Visit (INDEPENDENT_AMBULATORY_CARE_PROVIDER_SITE_OTHER): Payer: BLUE CROSS/BLUE SHIELD | Admitting: Rheumatology

## 2018-10-20 VITALS — BP 87/57 | HR 63 | Ht 67.25 in | Wt 182.8 lb

## 2018-10-20 DIAGNOSIS — M24521 Contracture, right elbow: Secondary | ICD-10-CM | POA: Diagnosis not present

## 2018-10-20 DIAGNOSIS — M7062 Trochanteric bursitis, left hip: Secondary | ICD-10-CM

## 2018-10-20 DIAGNOSIS — Z79899 Other long term (current) drug therapy: Secondary | ICD-10-CM

## 2018-10-20 DIAGNOSIS — M0600 Rheumatoid arthritis without rheumatoid factor, unspecified site: Secondary | ICD-10-CM | POA: Diagnosis not present

## 2018-10-20 DIAGNOSIS — M170 Bilateral primary osteoarthritis of knee: Secondary | ICD-10-CM

## 2018-10-20 DIAGNOSIS — G8929 Other chronic pain: Secondary | ICD-10-CM

## 2018-10-20 DIAGNOSIS — M25572 Pain in left ankle and joints of left foot: Secondary | ICD-10-CM

## 2018-10-20 DIAGNOSIS — M7061 Trochanteric bursitis, right hip: Secondary | ICD-10-CM

## 2018-10-20 DIAGNOSIS — M25571 Pain in right ankle and joints of right foot: Secondary | ICD-10-CM

## 2018-10-20 DIAGNOSIS — M24522 Contracture, left elbow: Secondary | ICD-10-CM

## 2018-10-20 DIAGNOSIS — I499 Cardiac arrhythmia, unspecified: Secondary | ICD-10-CM

## 2018-10-20 DIAGNOSIS — M5136 Other intervertebral disc degeneration, lumbar region: Secondary | ICD-10-CM

## 2018-10-20 DIAGNOSIS — Z8679 Personal history of other diseases of the circulatory system: Secondary | ICD-10-CM

## 2018-10-20 NOTE — Patient Instructions (Addendum)
Iliotibial Band Syndrome Rehab Ask your health care provider which exercises are safe for you. Do exercises exactly as told by your health care provider and adjust them as directed. It is normal to feel mild stretching, pulling, tightness, or discomfort as you do these exercises, but you should stop right away if you feel sudden pain or your pain gets worse.Do not begin these exercises until told by your health care provider. Stretching and range of motion exercises These exercises warm up your muscles and joints and improve the movement and flexibility of your hip and pelvis. Exercise A: Quadriceps, prone  1. Lie on your abdomen on a firm surface, such as a bed or padded floor. 2. Bend your left / right knee and hold your ankle. If you cannot reach your ankle or pant leg, loop a belt around your foot and grab the belt instead. 3. Gently pull your heel toward your buttocks. Your knee should not slide out to the side. You should feel a stretch in the front of your thigh and knee. 4. Hold this position for __________ seconds. Repeat __________ times. Complete this stretch __________ times a day. Exercise B: Iliotibial band  1. Lie on your side with your left / right leg in the top position. 2. Bend both of your knees and grab your left / right ankle. Stretch out your bottom arm to help you balance. 3. Slowly bring your top knee back so your thigh goes behind your trunk. 4. Slowly lower your top leg toward the floor until you feel a gentle stretch on the outside of your left / right hip and thigh. If you do not feel a stretch and your knee will not fall farther, place the heel of your other foot on top of your knee and pull your knee down toward the floor with your foot. 5. Hold this position for __________ seconds. Repeat __________ times. Complete this stretch __________ times a day. Strengthening exercises These exercises build strength and endurance in your hip and pelvis. Endurance is the  ability to use your muscles for a long time, even after they get tired. Exercise C: Straight leg raises ( hip abductors) 1. Lie on your side with your left / right leg in the top position. Lie so your head, shoulder, knee, and hip line up. You may bend your bottom knee to help you balance. 2. Roll your hips slightly forward so your hips are stacked directly over each other and your left / right knee is facing forward. 3. Tense the muscles in your outer thigh and lift your top leg 4-6 inches (10-15 cm). 4. Hold this position for __________ seconds. 5. Slowly return to the starting position. Let your muscles relax completely before doing another repetition. Repeat __________ times. Complete this exercise __________ times a day. Exercise D: Straight leg raises ( hip extensors) 1. Lie on your abdomen on your bed or a firm surface. You can put a pillow under your hips if that is more comfortable. 2. Bend your left / right knee so your foot is straight up in the air. 3. Squeeze your buttock muscles and lift your left / right thigh off the bed. Do not let your back arch. 4. Tense this muscle as hard as you can without increasing any knee pain. 5. Hold this position for __________ seconds. 6. Slowly lower your leg to the starting position and allow it to relax completely. Repeat __________ times. Complete this exercise __________ times a day. Exercise E: Hip  hike 1. Stand sideways on a bottom step. Stand on your left / right leg with your other foot unsupported next to the step. You can hold onto the railing or wall if needed for balance. 2. Keep your knees straight and your torso square. Then, lift your left / right hip up toward the ceiling. 3. Slowly let your left / right hip lower toward the floor, past the starting position. Your foot should get closer to the floor. Do not lean or bend your knees. Repeat __________ times. Complete this exercise __________ times a day. This information is not  intended to replace advice given to you by your health care provider. Make sure you discuss any questions you have with your health care provider. Document Released: 11/25/2005 Document Revised: 07/30/2016 Document Reviewed: 10/27/2015 Elsevier Interactive Patient Education  2018 [employer].  [employer] We placed an order today for your standing lab work.    Please come back and get your standing labs in January and every 3 months We have open lab Monday through Friday from 8:30-11:30 AM and 1:30-4:00 PM  at the office of Dr. Pollyann Savoy.   You may experience shorter wait times on Monday and Friday afternoons. The office is located at [address], [address], [place], [place] [address] No appointment is necessary.   Labs are drawn by [facility].  You may receive a bill from [facility] for your lab work. If you have any questions regarding directions or hours of operation,  please call [phone].   Just as a reminder please drink plenty of water prior to coming for your lab work. Thanks!

## 2018-10-21 DIAGNOSIS — M79605 Pain in left leg: Secondary | ICD-10-CM | POA: Diagnosis not present

## 2018-10-21 DIAGNOSIS — M5416 Radiculopathy, lumbar region: Secondary | ICD-10-CM | POA: Diagnosis not present

## 2018-10-21 DIAGNOSIS — M79604 Pain in right leg: Secondary | ICD-10-CM | POA: Diagnosis not present

## 2018-10-21 DIAGNOSIS — M545 Low back pain: Secondary | ICD-10-CM | POA: Diagnosis not present

## 2018-10-26 DIAGNOSIS — M79604 Pain in right leg: Secondary | ICD-10-CM | POA: Diagnosis not present

## 2018-10-26 DIAGNOSIS — M79605 Pain in left leg: Secondary | ICD-10-CM | POA: Diagnosis not present

## 2018-10-26 DIAGNOSIS — M545 Low back pain: Secondary | ICD-10-CM | POA: Diagnosis not present

## 2018-10-26 DIAGNOSIS — M5416 Radiculopathy, lumbar region: Secondary | ICD-10-CM | POA: Diagnosis not present

## 2018-11-03 DIAGNOSIS — M5416 Radiculopathy, lumbar region: Secondary | ICD-10-CM | POA: Diagnosis not present

## 2018-11-03 DIAGNOSIS — M79605 Pain in left leg: Secondary | ICD-10-CM | POA: Diagnosis not present

## 2018-11-03 DIAGNOSIS — M545 Low back pain: Secondary | ICD-10-CM | POA: Diagnosis not present

## 2018-11-03 DIAGNOSIS — M79604 Pain in right leg: Secondary | ICD-10-CM | POA: Diagnosis not present

## 2018-11-16 DIAGNOSIS — M5416 Radiculopathy, lumbar region: Secondary | ICD-10-CM | POA: Diagnosis not present

## 2018-11-16 DIAGNOSIS — M545 Low back pain: Secondary | ICD-10-CM | POA: Diagnosis not present

## 2018-11-16 DIAGNOSIS — M79605 Pain in left leg: Secondary | ICD-10-CM | POA: Diagnosis not present

## 2018-11-16 DIAGNOSIS — M79604 Pain in right leg: Secondary | ICD-10-CM | POA: Diagnosis not present

## 2018-11-23 DIAGNOSIS — M79604 Pain in right leg: Secondary | ICD-10-CM | POA: Diagnosis not present

## 2018-11-23 DIAGNOSIS — M79605 Pain in left leg: Secondary | ICD-10-CM | POA: Diagnosis not present

## 2018-11-23 DIAGNOSIS — M545 Low back pain: Secondary | ICD-10-CM | POA: Diagnosis not present

## 2018-11-23 DIAGNOSIS — M5416 Radiculopathy, lumbar region: Secondary | ICD-10-CM | POA: Diagnosis not present

## 2018-11-30 DIAGNOSIS — M5416 Radiculopathy, lumbar region: Secondary | ICD-10-CM | POA: Diagnosis not present

## 2018-11-30 DIAGNOSIS — M79605 Pain in left leg: Secondary | ICD-10-CM | POA: Diagnosis not present

## 2018-11-30 DIAGNOSIS — M79604 Pain in right leg: Secondary | ICD-10-CM | POA: Diagnosis not present

## 2018-11-30 DIAGNOSIS — M545 Low back pain: Secondary | ICD-10-CM | POA: Diagnosis not present

## 2018-12-03 ENCOUNTER — Other Ambulatory Visit: Payer: Self-pay | Admitting: Rheumatology

## 2018-12-03 NOTE — Telephone Encounter (Signed)
Last Visit: 10/20/18 Next Visit: 03/23/19 Labs: 09/10/18 WNL  Okay to refill per Dr. Corliss Skains

## 2018-12-17 DIAGNOSIS — M79604 Pain in right leg: Secondary | ICD-10-CM | POA: Diagnosis not present

## 2018-12-17 DIAGNOSIS — M545 Low back pain: Secondary | ICD-10-CM | POA: Diagnosis not present

## 2018-12-17 DIAGNOSIS — M79605 Pain in left leg: Secondary | ICD-10-CM | POA: Diagnosis not present

## 2018-12-17 DIAGNOSIS — M5416 Radiculopathy, lumbar region: Secondary | ICD-10-CM | POA: Diagnosis not present

## 2018-12-30 DIAGNOSIS — J111 Influenza due to unidentified influenza virus with other respiratory manifestations: Secondary | ICD-10-CM | POA: Diagnosis not present

## 2018-12-30 DIAGNOSIS — M791 Myalgia, unspecified site: Secondary | ICD-10-CM | POA: Diagnosis not present

## 2018-12-30 DIAGNOSIS — I10 Essential (primary) hypertension: Secondary | ICD-10-CM | POA: Diagnosis not present

## 2019-02-09 ENCOUNTER — Encounter: Payer: Self-pay | Admitting: Rheumatology

## 2019-02-09 DIAGNOSIS — I10 Essential (primary) hypertension: Secondary | ICD-10-CM | POA: Diagnosis not present

## 2019-02-09 DIAGNOSIS — Z0000 Encounter for general adult medical examination without abnormal findings: Secondary | ICD-10-CM | POA: Diagnosis not present

## 2019-02-09 DIAGNOSIS — R82998 Other abnormal findings in urine: Secondary | ICD-10-CM | POA: Diagnosis not present

## 2019-02-16 DIAGNOSIS — Z79899 Other long term (current) drug therapy: Secondary | ICD-10-CM | POA: Diagnosis not present

## 2019-02-16 DIAGNOSIS — I10 Essential (primary) hypertension: Secondary | ICD-10-CM | POA: Diagnosis not present

## 2019-02-16 DIAGNOSIS — Z0000 Encounter for general adult medical examination without abnormal findings: Secondary | ICD-10-CM | POA: Diagnosis not present

## 2019-02-16 DIAGNOSIS — M069 Rheumatoid arthritis, unspecified: Secondary | ICD-10-CM | POA: Diagnosis not present

## 2019-02-16 DIAGNOSIS — E7849 Other hyperlipidemia: Secondary | ICD-10-CM | POA: Diagnosis not present

## 2019-02-24 DIAGNOSIS — J029 Acute pharyngitis, unspecified: Secondary | ICD-10-CM | POA: Diagnosis not present

## 2019-02-26 ENCOUNTER — Telehealth: Payer: Self-pay | Admitting: Rheumatology

## 2019-02-26 NOTE — Telephone Encounter (Signed)
Returned patient phone call.  Informed her that she is due for labs prior to sending in a refill.  She recently had her physical with labs form her PCP. She was given office fax number so PCP can fax results.  Will send Rx when we receive results.  All questions encouraged and answered.  Instructed patient to call with any further questions or concerns.  Verlin Fester, PharmD, [facility] Rheumatology Clinical Pharmacist  02/26/2019 3:53 PM

## 2019-02-26 NOTE — Telephone Encounter (Signed)
Patient called requesting prescription refill of Tonya Strickland to be sent to the specialty pharmacy.

## 2019-03-02 MED ORDER — TOFACITINIB CITRATE ER 11 MG PO TB24: 1.0000 | ORAL_TABLET | Freq: Every day | ORAL | 0 refills | Status: DC

## 2019-03-02 NOTE — Telephone Encounter (Signed)
Last Visit: 10/20/18 Next Visit: 03/23/19 Labs: 02/08/19 Sodium 134, Alk phos 35, RBC 4.1   Okay to refill per Dr. Estanislado Pandy

## 2019-03-15 ENCOUNTER — Other Ambulatory Visit: Payer: Self-pay | Admitting: Rheumatology

## 2019-03-15 MED ORDER — TOFACITINIB CITRATE ER 11 MG PO TB24: 1.0000 | ORAL_TABLET | Freq: Every day | ORAL | 0 refills | Status: DC

## 2019-03-15 NOTE — Telephone Encounter (Signed)
Last Visit: 10/20/18 Next Visit: 03/23/19 Labs: 02/2019 WNL  Okay to refill per Dr. Corliss Skains

## 2019-03-23 ENCOUNTER — Ambulatory Visit: Payer: BLUE CROSS/BLUE SHIELD | Admitting: Rheumatology

## 2019-06-29 ENCOUNTER — Other Ambulatory Visit: Payer: Self-pay | Admitting: Rheumatology

## 2019-06-29 NOTE — Telephone Encounter (Signed)
Left message to advise patient she is due for labs and a follow up visit.

## 2019-06-30 NOTE — Progress Notes (Signed)
Office Visit Note  Patient: Tonya Strickland             Date of Birth: 1961-04-24           MRN: [id number]             PCP: Patient, No Pcp Per Referring: No ref. provider found Visit Date: 07/01/2019 Occupation: @GUAROCC @  Subjective:  Pain in both knees   History of Present Illness: Tonya Strickland is a 58 y.o. female with history of seronegative rheumatoid arthritis, osteoarthritis, and DDD. She is on Xeljanz 11 mg po daily.  She states that Harriette Ohara has been very effective for her.  She states that she does have chronic bilateral knee pain.  She is intermittent swelling in both knees.  She states about 1 month ago she went on a long bike ride and had a long walk afterward which she feels worsened her knee joint pain.  She has been using ice, salon pause patches, and Motrin for pain relief.  She plans on following up at [facility].  She has intermittent pain and swelling in bilateral hands.  She has no discomfort at this time.  She has noticed some decreased grip strength.   Activities of Daily Living:  Patient reports morning stiffness for 1  hour.   Patient Reports nocturnal pain.  Difficulty dressing/grooming: Denies Difficulty climbing stairs: Reports Difficulty getting out of chair: Reports Difficulty using hands for taps, buttons, cutlery, and/or writing: Reports  Review of Systems  Constitutional: Negative for fatigue.  HENT: Negative for mouth sores, mouth dryness and nose dryness.   Eyes: Negative for pain, visual disturbance and dryness.  Respiratory: Negative for cough, hemoptysis, shortness of breath and difficulty breathing.   Cardiovascular: Negative for chest pain, palpitations, hypertension and swelling in legs/feet.  Gastrointestinal: Negative for blood in stool, constipation and diarrhea.  Endocrine: Negative for increased urination.  Genitourinary: Negative for painful urination.  Musculoskeletal: Positive for arthralgias, joint pain, joint swelling and  morning stiffness. Negative for myalgias, muscle weakness, muscle tenderness and myalgias.  Skin: Negative for color change, pallor, rash, hair loss, nodules/bumps, skin tightness, ulcers and sensitivity to sunlight.  Allergic/Immunologic: Negative for susceptible to infections.  Neurological: Negative for dizziness, numbness, headaches and weakness.  Hematological: Negative for swollen glands.  Psychiatric/Behavioral: Negative for depressed mood and sleep disturbance. The patient is not nervous/anxious.     PMFS History:  Patient Active Problem List   Diagnosis Date Noted  . Primary osteoarthritis of both knees 10/09/2018  . Contracture of joint of both elbows 10/09/2018  . Seronegative rheumatoid arthritis (HCC) 09/10/2018  . Irregular heart beat 09/10/2018  . History of hypertension 09/10/2018  . DDD (degenerative disc disease), lumbar 09/10/2018    Past Medical History:  Diagnosis Date  . Hypertension   . Rheumatoid arthritis (HCC)     Family History  Problem Relation Age of Onset  . Thyroid disease Mother        thyroid cancer   . Glaucoma Father   . Hypertension Father   . Neuropathy Father   . Arthritis Father   . Arthritis Brother   . Hypertension Brother   . Healthy Son   . Healthy Son   . Breast cancer Neg Hx    Past Surgical History:  Procedure Laterality Date  . ACHILLES TENDON REPAIR    . CESAREAN SECTION     Social History   Social History Narrative  . Not on file    There is no immunization  history on file for this patient.   Objective: Vital Signs: BP (!) 141/77 (BP Location: Right Arm, Patient Position: Sitting, Cuff Size: Large)   Pulse (!) 56   Resp 13   Ht 5\' 8"  (1.727 m)   Wt 186 lb 9.6 oz (84.6 kg)   LMP 06/09/2015 (Approximate)   BMI 28.37 kg/m    Physical Exam Vitals signs and nursing note reviewed.  Constitutional:      Appearance: She is well-developed.  HENT:     Head: Normocephalic and atraumatic.  Eyes:      Conjunctiva/sclera: Conjunctivae normal.  Neck:     Musculoskeletal: Normal range of motion.  Cardiovascular:     Rate and Rhythm: Normal rate and regular rhythm.     Heart sounds: Normal heart sounds.  Pulmonary:     Effort: Pulmonary effort is normal.     Breath sounds: Normal breath sounds.  Abdominal:     General: Bowel sounds are normal.     Palpations: Abdomen is soft.  Lymphadenopathy:     Cervical: No cervical adenopathy.  Skin:    General: Skin is warm and dry.     Capillary Refill: Capillary refill takes less than 2 seconds.  Neurological:     Mental Status: She is alert and oriented to person, place, and time.  Psychiatric:        Behavior: Behavior normal.      Musculoskeletal Exam: C-spine, thoracic spine, lumbar spine good range of motion.  No midline spinal tenderness.  No SI joint tenderness.  Shoulders joints, MCPs, PIPs, DIPs good range of motion no synovitis.  She has bilateral elbow joint contractures but no tenderness or inflammation was noted.  She has limited extension of bilateral wrist joints.  She has synovial thickening of bilateral wrist joints but no synovitis.  She has synovial thickening of bilateral second and third MCP joints.  She has complete fist formation bilaterally.  Hip joints, knee joints, ankle joints, MTPs and PIPs MTPs good range of motion no synovitis.  No warmth or effusion of bilateral knee joints.  She is synovial thickening of bilateral knee joints.  She has bilateral knee crepitus.  No tenderness of ankle joints.  She does have pedal edema bilaterally.  No tenderness over trochanteric bursa bilaterally.  CDAI Exam: CDAI Score: - Patient Global: -; Provider Global: - Swollen: -; Tender: - Joint Exam   No joint exam has been documented for this visit   There is currently no information documented on the homunculus. Go to the Rheumatology activity and complete the homunculus joint exam.  Investigation: No additional findings.   Imaging: No results found.  Recent Labs: Lab Results  Component Value Date   WBC 6.2 09/10/2018   HGB 12.1 09/10/2018   PLT 230 09/10/2018   NA 138 09/10/2018   K 4.4 09/10/2018   CL 102 09/10/2018   CO2 24 09/10/2018   GLUCOSE 87 09/10/2018   BUN 16 09/10/2018   CREATININE 0.80 09/10/2018   BILITOT 0.6 09/10/2018   AST 17 09/10/2018   ALT 18 09/10/2018   PROT 7.0 09/10/2018   PROT 7.0 09/10/2018   CALCIUM 9.6 09/10/2018   GFRAA 96 09/10/2018    Speciality Comments: Immunospuressant Baseline Lab Work: TB gold negative June 2019  Procedures:  No procedures performed Allergies: Hctz [hydrochlorothiazide]   Assessment / Plan:     Visit Diagnoses: Seronegative rheumatoid arthritis (HCC) - She has no synovitis on exam.  She has not had any recent rheumatoid  arthritis flares.  She is clinically doing well on Xeljanz 11 mg p.o. daily.  She has not missed any doses of Xeljanz recently.  She has chronic bilateral elbow joint contractures.  She has limited extension and synovial thickening of bilateral wrist joints.  She has no tenderness or synovitis on exam today.  She does have synovial thickening of bilateral second and third MCP joints.  She continues to have chronic pain in bilateral knee joints.  No warmth or effusion was noted on exam.  She synovial thickening and crepitus of bilateral knee joints.  She is planning on following up at [place] for further evaluation.  She will continue taking Xeljanz as prescribed.  A refill of Morrie Sheldon was sent to the pharmacy today.  She was also given a sample of Xeljanz until her prescription is delivered.  She was advised to notify us if develops increased joint pain or joint swelling.  She will follow-up in the office in 5 months.  High risk medication use - Xeljanz 11 mg p.o. daily, (MTX-discontinued due to side effects, previously tried Humira and Enbrel 2016) she was advised to hold Morrie Sheldon if she develops any signs or symptoms of  an infection and to resume once infection is completely cleared.  We discussed importance of social distancing and following standard precautions recommended by the CDC.  CBC and CMP are drawn today to monitor for drug toxicity.  She will return in October and every 3 months for lab work.  TB gold was also updated today.- Plan: CBC with Differential/Platelet, COMPLETE METABOLIC PANEL WITH GFR, QuantiFERON-TB Gold Plu  Contracture of joint of both elbows - She has bilateral elbow joint contractures-10 degree flexion contractures.  She has no tenderness or inflammation on exam today.  Primary osteoarthritis of both knees - Chronic pain.  She had x-rays of both knee joints performed on 09/10/2018 that revealed mild to moderate osteoarthritis and severe chondromalacia patella.  She has synovial thickening of both knee joints. She has good range of motion both knees with knee joint crepitus. No warmth or effusion was noted on exam today.  About 1 month ago she was biking and walking longer than usual and started having increased pain in both knee joints.  She has been using salonpas patches, taking Motrin, and applying ice for pain relief.  She is planning on following up at [place] for further evaluation.  Trochanteric bursitis of both hips - Resolved   DDD (degenerative disc disease), lumbar - She has intermittent lower back pain. She has no midline spinal tenderness.  She has good ROM with no discomfort.   History of hypertension   Irregular heart beat   Orders: Orders Placed This Encounter  Procedures  . CBC with Differential/Platelet  . COMPLETE METABOLIC PANEL WITH GFR  . QuantiFERON-TB Gold Plus   Meds ordered this encounter  Medications  . Tofacitinib Citrate ER (XELJANZ XR) 11 MG TB24    Sig: Take 1 tablet by mouth daily.    Dispense:  90 tablet    Refill:  0     Follow-Up Instructions: Return in about 5 months (around 12/01/2019) for Rheumatoid arthritis.   Ofilia Neas, PA-C  Note - This record has been created using Dragon software.  Chart creation errors have been sought, but may not always  have been located. Such creation errors do not reflect on  the standard of medical care.

## 2019-07-01 ENCOUNTER — Encounter: Payer: Self-pay | Admitting: Physician Assistant

## 2019-07-01 ENCOUNTER — Ambulatory Visit (INDEPENDENT_AMBULATORY_CARE_PROVIDER_SITE_OTHER): Payer: BC Managed Care – PPO | Admitting: Physician Assistant

## 2019-07-01 ENCOUNTER — Other Ambulatory Visit: Payer: Self-pay

## 2019-07-01 VITALS — BP 141/77 | HR 56 | Resp 13 | Ht 68.0 in | Wt 186.6 lb

## 2019-07-01 DIAGNOSIS — M170 Bilateral primary osteoarthritis of knee: Secondary | ICD-10-CM

## 2019-07-01 DIAGNOSIS — M0600 Rheumatoid arthritis without rheumatoid factor, unspecified site: Secondary | ICD-10-CM

## 2019-07-01 DIAGNOSIS — I499 Cardiac arrhythmia, unspecified: Secondary | ICD-10-CM

## 2019-07-01 DIAGNOSIS — M5136 Other intervertebral disc degeneration, lumbar region: Secondary | ICD-10-CM

## 2019-07-01 DIAGNOSIS — Z8679 Personal history of other diseases of the circulatory system: Secondary | ICD-10-CM

## 2019-07-01 DIAGNOSIS — M7062 Trochanteric bursitis, left hip: Secondary | ICD-10-CM

## 2019-07-01 DIAGNOSIS — M24522 Contracture, left elbow: Secondary | ICD-10-CM

## 2019-07-01 DIAGNOSIS — Z79899 Other long term (current) drug therapy: Secondary | ICD-10-CM

## 2019-07-01 DIAGNOSIS — M24521 Contracture, right elbow: Secondary | ICD-10-CM | POA: Diagnosis not present

## 2019-07-01 DIAGNOSIS — M7061 Trochanteric bursitis, right hip: Secondary | ICD-10-CM

## 2019-07-01 MED ORDER — XELJANZ XR 11 MG PO TB24: 1.0000 | ORAL_TABLET | Freq: Every day | ORAL | 0 refills | Status: DC

## 2019-07-01 NOTE — Progress Notes (Signed)
Medication Samples have been provided to the patient.  Drug name: Morrie Sheldon    Strength: 11 mg      Qty: 1  LOT: [id number]  Exp.Date: 03/2020  Dosing instructions: Take one tablet by mouth daily.   The patient has been instructed regarding the correct time, dose, and frequency of taking this medication, including desired effects and most common side effects.   Gwenlyn Perking 2:20 PM 07/01/2019

## 2019-07-01 NOTE — Patient Instructions (Addendum)
Hand Exercises Hand exercises can be helpful for almost anyone. These exercises can strengthen the hands, improve flexibility and movement, and increase blood flow to the hands. These results can make work and daily tasks easier. Hand exercises can be especially helpful for people who have joint pain from arthritis or have nerve damage from overuse (carpal tunnel syndrome). These exercises can also help people who have injured a hand. Exercises Most of these hand exercises are gentle stretching and motion exercises. It is usually safe to do them often throughout the day. Warming up your hands before exercise may help to reduce stiffness. You can do this with gentle massage or by placing your hands in warm water for 10-15 minutes. It is normal to feel some stretching, pulling, tightness, or mild discomfort as you begin new exercises. This will gradually improve. Stop an exercise right away if you feel sudden, severe pain or your pain gets worse. Ask your health care provider which exercises are best for you. Knuckle bend or "claw" fist 1. Stand or sit with your arm, hand, and all five fingers pointed straight up. Make sure to keep your wrist straight during the exercise. 2. Gently bend your fingers down toward your palm until the tips of your fingers are touching the top of your palm. Keep your big knuckle straight and just bend the small knuckles in your fingers. 3. Hold this position for __________ seconds. 4. Straighten (extend) your fingers back to the starting position. Repeat this exercise 5-10 times with each hand. Full finger fist 1. Stand or sit with your arm, hand, and all five fingers pointed straight up. Make sure to keep your wrist straight during the exercise. 2. Gently bend your fingers into your palm until the tips of your fingers are touching the middle of your palm. 3. Hold this position for __________ seconds. 4. Extend your fingers back to the starting position, stretching every  joint fully. Repeat this exercise 5-10 times with each hand. Straight fist 1. Stand or sit with your arm, hand, and all five fingers pointed straight up. Make sure to keep your wrist straight during the exercise. 2. Gently bend your fingers at the big knuckle, where your fingers meet your hand, and the middle knuckle. Keep the knuckle at the tips of your fingers straight and try to touch the bottom of your palm. 3. Hold this position for __________ seconds. 4. Extend your fingers back to the starting position, stretching every joint fully. Repeat this exercise 5-10 times with each hand. Tabletop 1. Stand or sit with your arm, hand, and all five fingers pointed straight up. Make sure to keep your wrist straight during the exercise. 2. Gently bend your fingers at the big knuckle, where your fingers meet your hand, as far down as you can while keeping the small knuckles in your fingers straight. Think of forming a tabletop with your fingers. 3. Hold this position for __________ seconds. 4. Extend your fingers back to the starting position, stretching every joint fully. Repeat this exercise 5-10 times with each hand. Finger spread 1. Place your hand flat on a table with your palm facing down. Make sure your wrist stays straight as you do this exercise. 2. Spread your fingers and thumb apart from each other as far as you can until you feel a gentle stretch. Hold this position for __________ seconds. 3. Bring your fingers and thumb tight together again. Hold this position for __________ seconds. Repeat this exercise 5-10 times with each hand.   Making circles 1. Stand or sit with your arm, hand, and all five fingers pointed straight up. Make sure to keep your wrist straight during the exercise. 2. Make a circle by touching the tip of your thumb to the tip of your index finger. 3. Hold for __________ seconds. Then open your hand wide. 4. Repeat this motion with your thumb and each finger on your hand.  Repeat this exercise 5-10 times with each hand. Thumb motion 1. Sit with your forearm resting on a table and your wrist straight. Your thumb should be facing up toward the ceiling. Keep your fingers relaxed as you move your thumb. 2. Lift your thumb up as high as you can toward the ceiling. Hold for __________ seconds. 3. Bend your thumb across your palm as far as you can, reaching the tip of your thumb for the small finger (pinkie) side of your palm. Hold for __________ seconds. Repeat this exercise 5-10 times with each hand. Grip strengthening  1. Hold a stress ball or other soft ball in the middle of your hand. 2. Slowly increase the pressure, squeezing the ball as much as you can without causing pain. Think of bringing the tips of your fingers into the middle of your palm. All of your finger joints should bend when doing this exercise. 3. Hold your squeeze for __________ seconds, then relax. Repeat this exercise 5-10 times with each hand. Contact a health care provider if:  Your hand pain or discomfort gets much worse when you do an exercise.  Your hand pain or discomfort does not improve within 2 hours after you exercise. If you have any of these problems, stop doing these exercises right away. Do not do them again unless your health care provider says that you can. Get help right away if:  You develop sudden, severe hand pain or swelling. If this happens, stop doing these exercises right away. Do not do them again unless your health care provider says that you can. This information is not intended to replace advice given to you by your health care provider. Make sure you discuss any questions you have with your health care provider. Document Released: 11/06/2015 Document Revised: 03/18/2019 Document Reviewed: 11/26/2018 Elsevier Patient Education  2020 [employer] We placed an order today for your standing lab work.    Please come back and get your standing  labs in October and every 3 months   We have open lab daily Monday through Thursday from 8:30-12:30 PM and 1:30-4:30 PM and Friday from 8:30-12:30 PM and 1:30 -4:00 PM at the office of Dr. Bo Merino.   You may experience shorter wait times on Monday and Friday afternoons. The office is located at [address], [place], [place], [place] [address] No appointment is necessary.   Labs are drawn by [facility].  You may receive a bill from [facility] for your lab work.  If you wish to have your labs drawn at another location, please call the office 24 hours in advance to send orders.  If you have any questions regarding directions or hours of operation,  please call [phone].   Just as a reminder please drink plenty of water prior to coming for your lab work. Thanks!

## 2019-07-02 NOTE — Progress Notes (Signed)
Hct borderline low.  Rest of CBC WNL.  Alk phos is borderline low.  Please add in phosphorous.   ALT is borderline elevated. She is taking Crestor.  Pease advise patient to avoid tylenol, NSAIDs, and alcohol.  Please forward labs to PCP.

## 2019-07-04 LAB — CBC WITH DIFFERENTIAL/PLATELET
Absolute Monocytes: 319 cells/uL (ref 200–950)
Basophils Absolute: 29 cells/uL (ref 0–200)
Basophils Relative: 0.6 %
Eosinophils Absolute: 49 cells/uL (ref 15–500)
Eosinophils Relative: 1 %
HCT: 34.5 % — ABNORMAL LOW (ref 35.0–45.0)
Hemoglobin: 11.9 g/dL (ref 11.7–15.5)
Lymphs Abs: 2254 cells/uL (ref 850–3900)
MCH: 30.7 pg (ref 27.0–33.0)
MCHC: 34.5 g/dL (ref 32.0–36.0)
MCV: 88.9 fL (ref 80.0–100.0)
MPV: 10.9 fL (ref 7.5–12.5)
Monocytes Relative: 6.5 %
Neutro Abs: 2249 cells/uL (ref 1500–7800)
Neutrophils Relative %: 45.9 %
Platelets: 226 10*3/uL (ref 140–400)
RBC: 3.88 10*6/uL (ref 3.80–5.10)
RDW: 13.1 % (ref 11.0–15.0)
Total Lymphocyte: 46 %
WBC: 4.9 10*3/uL (ref 3.8–10.8)

## 2019-07-04 LAB — COMPLETE METABOLIC PANEL WITH GFR
AG Ratio: 1.9 (calc) (ref 1.0–2.5)
ALT: 34 U/L — ABNORMAL HIGH (ref 6–29)
AST: 25 U/L (ref 10–35)
Albumin: 4.6 g/dL (ref 3.6–5.1)
Alkaline phosphatase (APISO): 32 U/L — ABNORMAL LOW (ref 37–153)
BUN: 13 mg/dL (ref 7–25)
CO2: 26 mmol/L (ref 20–32)
Calcium: 9.7 mg/dL (ref 8.6–10.4)
Chloride: 98 mmol/L (ref 98–110)
Creat: 0.7 mg/dL (ref 0.50–1.05)
GFR, Est African American: 113 mL/min/{1.73_m2} (ref >=60)
GFR, Est Non African American: 98 mL/min/{1.73_m2} (ref >=60)
Globulin: 2.4 g/dL (calc) (ref 1.9–3.7)
Glucose, Bld: 89 mg/dL (ref 65–99)
Potassium: 4.4 mmol/L (ref 3.5–5.3)
Sodium: 133 mmol/L — ABNORMAL LOW (ref 135–146)
Total Bilirubin: 0.6 mg/dL (ref 0.2–1.2)
Total Protein: 7 g/dL (ref 6.1–8.1)

## 2019-07-04 LAB — TEST AUTHORIZATION

## 2019-07-04 LAB — QUANTIFERON-TB GOLD PLUS
Mitogen-NIL: >10 IU/mL
NIL: 0.01 IU/mL
QuantiFERON-TB Gold Plus: NEGATIVE
TB1-NIL: 0 IU/mL
TB2-NIL: 0 IU/mL

## 2019-07-04 LAB — PHOSPHORUS: Phosphorus: 3.9 mg/dL (ref 2.5–4.5)

## 2019-07-05 NOTE — Progress Notes (Signed)
TB gold negative.  Phosphorus is WNL.

## 2019-08-27 ENCOUNTER — Ambulatory Visit: Payer: BLUE CROSS/BLUE SHIELD | Admitting: Obstetrics and Gynecology

## 2019-08-27 ENCOUNTER — Telehealth: Payer: Self-pay | Admitting: *Deleted

## 2019-08-27 NOTE — Telephone Encounter (Signed)
Faxed received stating prior authorization will expire soon.   Key: [id number]

## 2019-08-27 NOTE — Telephone Encounter (Signed)
Received notification from [facility] regarding a prior authorization for XELJANZ XR 11mg . Authorization has been APPROVED from 07/28/19 to 08/26/20.   Will send document to scan center.  Authorization # [id number]   1:45 PM Beatriz Chancellor, CPhT

## 2019-09-21 ENCOUNTER — Other Ambulatory Visit: Payer: Self-pay | Admitting: Physician Assistant

## 2019-09-21 NOTE — Telephone Encounter (Signed)
Last Visit: 07/01/19 Next Visit due December 2020. Message sent to the front to schedule  Labs: 07/01/19 Alk phos is borderline low. ALT is borderline elevated.  Okay to refill per Dr. Estanislado Pandy

## 2019-09-21 NOTE — Telephone Encounter (Signed)
Please schedule patient a follow up visit. Patient due December 2020. Thanks!  

## 2019-09-21 NOTE — Telephone Encounter (Signed)
I LMOM for patient to call, and schedule a follow up appointment in December with Dr. Estanislado Pandy.

## 2019-09-24 DIAGNOSIS — Z23 Encounter for immunization: Secondary | ICD-10-CM | POA: Diagnosis not present

## 2019-09-24 NOTE — Progress Notes (Signed)
Office Visit Note  Patient: Tonya Strickland             Date of Birth: [birth year]           MRN: [id number]             PCP: Patient, No Pcp Per Referring: No ref. provider found Visit Date: 10/04/2019 Occupation: @GUAROCC @  Subjective:  Pain in both hands    History of Present Illness: Tonya Strickland is a 58 y.o. female with history of seronegative rheumatoid arthritis, osteoarthritis, and DDD.  She is taking Xeljanz 11 mg XR daily.  She denies has not missed any doses recently.  She denies any recent infections.  She denies any recent rheumatoid arthritis flares.  She states that she has been having some increased pain in both hands.  She states at times she notices intermittent swelling.  She is also having more difficulty opening jars recently.  She continues to have chronic pain in both knee joints.  She states that the pain is exacerbated by high intensity activities.  She states she recently hiked 5 miles on rounds which cause discomfort the next day.  She states that she continues to walk for exercise on a regular basis.  She denies any other joint pain or joint swelling at this time.  She takes Advil very sparingly for pain relief.  She currently rates her pain a 3 out of 10.    Activities of Daily Living:  Patient reports morning stiffness for several hours.   Patient Reports nocturnal pain.  Difficulty dressing/grooming: Denies Difficulty climbing stairs: Denies Difficulty getting out of chair: Denies Difficulty using hands for taps, buttons, cutlery, and/or writing: Reports  Review of Systems  Constitutional: Negative for fatigue.  HENT: Negative for mouth sores, mouth dryness and nose dryness.   Eyes: Negative for pain, itching, visual disturbance and dryness.  Respiratory: Negative for cough, hemoptysis, shortness of breath, wheezing and difficulty breathing.   Cardiovascular: Negative for chest pain, palpitations, hypertension and swelling in legs/feet.  Gastrointestinal:  Negative for blood in stool, constipation and diarrhea.  Endocrine: Negative for increased urination.  Genitourinary: Negative for difficulty urinating and painful urination.  Musculoskeletal: Positive for arthralgias, joint pain, joint swelling and morning stiffness. Negative for myalgias, muscle weakness, muscle tenderness and myalgias.  Skin: Negative for color change, pallor, rash, hair loss, nodules/bumps, skin tightness, ulcers and sensitivity to sunlight.  Allergic/Immunologic: Negative for susceptible to infections.  Neurological: Negative for dizziness, light-headedness, numbness, headaches, memory loss and weakness.  Hematological: Negative for bruising/bleeding tendency and swollen glands.  Psychiatric/Behavioral: Positive for sleep disturbance. Negative for depressed mood and confusion. The patient is not nervous/anxious.     PMFS History:  Patient Active Problem List   Diagnosis Date Noted  . Primary osteoarthritis of both knees 10/09/2018  . Contracture of joint of both elbows 10/09/2018  . Seronegative rheumatoid arthritis (HCC) 09/10/2018  . Irregular heart beat 09/10/2018  . History of hypertension 09/10/2018  . DDD (degenerative disc disease), lumbar 09/10/2018    Past Medical History:  Diagnosis Date  . Hypertension   . Rheumatoid arthritis (HCC)     Family History  Problem Relation Age of Onset  . Thyroid disease Mother        thyroid cancer   . Glaucoma Father   . Hypertension Father   . Neuropathy Father   . Arthritis Father   . Arthritis Brother   . Hypertension Brother   . Healthy Son   . Healthy Son   .  Breast cancer Neg Hx    Past Surgical History:  Procedure Laterality Date  . ACHILLES TENDON REPAIR    . CESAREAN SECTION     Social History   Social History Narrative  . Not on file    There is no immunization history on file for this patient.   Objective: Vital Signs: BP 137/84 (BP Location: Left Arm, Patient Position: Sitting, Cuff  Size: Normal)   Pulse (!) 53   Resp 13   Ht 5\' 8"  (1.727 m)   Wt 184 lb 6.4 oz (83.6 kg)   LMP 06/09/2015 (Approximate)   BMI 28.04 kg/m    Physical Exam Vitals signs and nursing note reviewed.  Constitutional:      Appearance: She is well-developed.  HENT:     Head: Normocephalic and atraumatic.  Eyes:     Conjunctiva/sclera: Conjunctivae normal.  Neck:     Musculoskeletal: Normal range of motion.  Cardiovascular:     Rate and Rhythm: Normal rate and regular rhythm.     Heart sounds: Normal heart sounds.  Pulmonary:     Effort: Pulmonary effort is normal.     Breath sounds: Normal breath sounds.  Abdominal:     General: Bowel sounds are normal.     Palpations: Abdomen is soft.  Lymphadenopathy:     Cervical: No cervical adenopathy.  Skin:    General: Skin is warm and dry.     Capillary Refill: Capillary refill takes less than 2 seconds.  Neurological:     Mental Status: She is alert and oriented to person, place, and time.  Psychiatric:        Behavior: Behavior normal.      Musculoskeletal Exam: C-spine, thoracic spine, and lumbar spine good range of motion.  No midline spinal tenderness.  No SI joint tenderness.  Shoulder joints good range of motion with no discomfort.  Bilateral elbow joint contractures.   She has synovial thickening of bilateral wrist joints with limited extension.  MCPs, PIPs and DIPs good range of motion no synovitis.  Synovial thickening of bilateral second and third MCP joints.  Hip joints, knee joints and ankle joints of MTPs, PIPs and DIPs good range of motion no synovitis.  She has synovial thickening of bilateral knee joints.  Bilateral knee crepitus.  No warmth or effusion of knee joints noted.  No tenderness or swelling of ankle joints.  She does have some mild pedal edema bilaterally.  CDAI Exam: CDAI Score: 0.6  Patient Global: 3 mm; Provider Global: 3 mm Swollen: 0 ; Tender: 0  Joint Exam   No joint exam has been documented for this  visit   There is currently no information documented on the homunculus. Go to the Rheumatology activity and complete the homunculus joint exam.  Investigation: No additional findings.  Imaging: No results found.  Recent Labs: Lab Results  Component Value Date   WBC 4.9 07/01/2019   HGB 11.9 07/01/2019   PLT 226 07/01/2019   NA 133 (L) 07/01/2019   K 4.4 07/01/2019   CL 98 07/01/2019   CO2 26 07/01/2019   GLUCOSE 89 07/01/2019   BUN 13 07/01/2019   CREATININE 0.70 07/01/2019   BILITOT 0.6 07/01/2019   AST 25 07/01/2019   ALT 34 (H) 07/01/2019   PROT 7.0 07/01/2019   CALCIUM 9.7 07/01/2019   GFRAA 113 07/01/2019   QFTBGOLDPLUS NEGATIVE 07/01/2019    Speciality Comments: Immunospuressant Baseline Lab Work: TB gold negative June 2019  Procedures:  No  procedures performed Allergies: Hctz [hydrochlorothiazide]   Assessment / Plan:     Visit Diagnoses: Seronegative rheumatoid arthritis ([place]): She has no synovitis on exam today.  She has not had any recent rheumatoid arthritis flares.  She has been experiencing some increased discomfort and stiffness in both hands.  She has some difficulties opening jars at times.  She has synovial thickening and limited extension of bilateral wrist joints.  She has bilateral elbow joint contractures which are stable and unchanged.  She has chronic pain in both knee joints but has not noticed any increased inflammation. She has no warmth or effusion on exam today.  She has synovial thickening in both knee joints with bilateral knee crepitus.  Overall she is clinically doing well on Xeljanz 11 mg XR daily.  She has not missed any doses recently.  She has not had any recent infections.  She will continue on this current treatment regimen.  She is advised to notify us if she develops increased joint pain or joint swelling.  According to the patient she will be moving to [place] in February and may be transferring care at that time.  She would like to  have 1 more follow-up visit prior to the move, so she will schedule an office visit accordingly.  High risk medication use - Xeljanz XR 11 mg 1 tablet daily.  Last TB gold negative on 07/01/2019 and will monitor yearly.  Last lipid panel within normal limits on 02/09/2019 and will monitor yearly.  She is on Crestor 10 mg daily prescribed by her PCP. Most recent CBC/CMP within normal limits except for borderline elevated ALT on 07/01/2019.  Due for CBC/CMP today and will monitor every 3 months.   (MTX-discontinued due to side effects, previously tried Humira and Enbrel 2016) - Plan: CBC with Differential/Platelet, COMPLETE METABOLIC PANEL WITH GFR  Contracture of joint of both elbows:  Stable and unchanged bilateral elbow joint contractures.   Primary osteoarthritis of both knees: She has good range of motion of bilateral knee joints.  She is synovial thickening of both knees but no warmth or effusion was noted.  She has bilateral knee crepitus.  She experiences intermittent pain and swelling in both knees with high intensity activities peers.  She [place] which caused increased discomfort in both knee joints the following day.  She states she still been able to walk for exercise on a regular basis.  Trochanteric bursitis of both hips: Resolved.  She has no trochanteric bursa tenderness on exam today.  DDD (degenerative disc disease), lumbar: She has good range of motion with no discomfort.  She has no symptoms of radiculopathy.  Other medical conditions are listed as follows:   History of hypertension  Irregular heart beat  Orders: Orders Placed This Encounter  Procedures  . CBC with Differential/Platelet  . COMPLETE METABOLIC PANEL WITH GFR   No orders of the defined types were placed in this encounter.     Follow-Up Instructions: Return for Rheumatoid arthritis, Osteoarthritis, DDD.   Ofilia Neas, PA-C  Note - This record has been created using Dragon  software.  Chart creation errors have been sought, but may not always  have been located. Such creation errors do not reflect on  the standard of medical care.

## 2019-09-25 ENCOUNTER — Other Ambulatory Visit: Payer: Self-pay | Admitting: Physician Assistant

## 2019-10-04 ENCOUNTER — Ambulatory Visit (INDEPENDENT_AMBULATORY_CARE_PROVIDER_SITE_OTHER): Payer: BC Managed Care – PPO | Admitting: Physician Assistant

## 2019-10-04 ENCOUNTER — Encounter: Payer: Self-pay | Admitting: Physician Assistant

## 2019-10-04 ENCOUNTER — Other Ambulatory Visit: Payer: Self-pay

## 2019-10-04 VITALS — BP 137/84 | HR 53 | Resp 13 | Ht 68.0 in | Wt 184.4 lb

## 2019-10-04 DIAGNOSIS — M7061 Trochanteric bursitis, right hip: Secondary | ICD-10-CM

## 2019-10-04 DIAGNOSIS — I499 Cardiac arrhythmia, unspecified: Secondary | ICD-10-CM

## 2019-10-04 DIAGNOSIS — M24521 Contracture, right elbow: Secondary | ICD-10-CM

## 2019-10-04 DIAGNOSIS — Z79899 Other long term (current) drug therapy: Secondary | ICD-10-CM | POA: Diagnosis not present

## 2019-10-04 DIAGNOSIS — M7062 Trochanteric bursitis, left hip: Secondary | ICD-10-CM

## 2019-10-04 DIAGNOSIS — M170 Bilateral primary osteoarthritis of knee: Secondary | ICD-10-CM

## 2019-10-04 DIAGNOSIS — Z8679 Personal history of other diseases of the circulatory system: Secondary | ICD-10-CM

## 2019-10-04 DIAGNOSIS — M0600 Rheumatoid arthritis without rheumatoid factor, unspecified site: Secondary | ICD-10-CM

## 2019-10-04 DIAGNOSIS — M24522 Contracture, left elbow: Secondary | ICD-10-CM

## 2019-10-04 DIAGNOSIS — M5136 Other intervertebral disc degeneration, lumbar region: Secondary | ICD-10-CM

## 2019-10-05 LAB — CBC WITH DIFFERENTIAL/PLATELET
Absolute Monocytes: 299 cells/uL (ref 200–950)
Basophils Absolute: 29 cells/uL (ref 0–200)
Basophils Relative: 0.6 %
Eosinophils Absolute: 49 cells/uL (ref 15–500)
Eosinophils Relative: 1 %
HCT: 36.6 % (ref 35.0–45.0)
Hemoglobin: 12.1 g/dL (ref 11.7–15.5)
Lymphs Abs: 1593 cells/uL (ref 850–3900)
MCH: 29.5 pg (ref 27.0–33.0)
MCHC: 33.1 g/dL (ref 32.0–36.0)
MCV: 89.3 fL (ref 80.0–100.0)
MPV: 11 fL (ref 7.5–12.5)
Monocytes Relative: 6.1 %
Neutro Abs: 2930 cells/uL (ref 1500–7800)
Neutrophils Relative %: 59.8 %
Platelets: 236 10*3/uL (ref 140–400)
RBC: 4.1 10*6/uL (ref 3.80–5.10)
RDW: 12.8 % (ref 11.0–15.0)
Total Lymphocyte: 32.5 %
WBC: 4.9 10*3/uL (ref 3.8–10.8)

## 2019-10-05 LAB — COMPLETE METABOLIC PANEL WITH GFR
AG Ratio: 2.2 (calc) (ref 1.0–2.5)
ALT: 21 U/L (ref 6–29)
AST: 20 U/L (ref 10–35)
Albumin: 4.8 g/dL (ref 3.6–5.1)
Alkaline phosphatase (APISO): 35 U/L — ABNORMAL LOW (ref 37–153)
BUN: 11 mg/dL (ref 7–25)
CO2: 24 mmol/L (ref 20–32)
Calcium: 9.6 mg/dL (ref 8.6–10.4)
Chloride: 102 mmol/L (ref 98–110)
Creat: 0.8 mg/dL (ref 0.50–1.05)
GFR, Est African American: 96 mL/min/{1.73_m2} (ref >=60)
GFR, Est Non African American: 82 mL/min/{1.73_m2} (ref >=60)
Globulin: 2.2 g/dL (calc) (ref 1.9–3.7)
Glucose, Bld: 90 mg/dL (ref 65–99)
Potassium: 4.4 mmol/L (ref 3.5–5.3)
Sodium: 137 mmol/L (ref 135–146)
Total Bilirubin: 0.6 mg/dL (ref 0.2–1.2)
Total Protein: 7 g/dL (ref 6.1–8.1)

## 2019-10-05 NOTE — Progress Notes (Signed)
CBC WNL.  Alk phos is borderline low but trending up.  Rest of CMP WNL

## 2019-10-06 ENCOUNTER — Ambulatory Visit: Payer: BC Managed Care – PPO | Admitting: Obstetrics and Gynecology

## 2019-11-29 ENCOUNTER — Telehealth: Payer: Self-pay | Admitting: Rheumatology

## 2019-11-29 NOTE — Telephone Encounter (Signed)
Last Visit: 10/04/2019 Next Visit: 01/03/2020 Labs: 10/04/2019 CBC WNL. Alk phos is borderline low but trending up. Rest of CMP WNL. TB Gold: 07/01/2019 negative   Patient is resolving address issue with [facility].  Okay to provide sample. patient will come by the office this afternoon to pick up a sample.

## 2019-11-29 NOTE — Telephone Encounter (Signed)
Patient calling because [facility] sent her Rx to her old address in [place] by mistake. Patient request a sample of Xeljanz 11 mg to get her thru the holidays. Please call to advise.

## 2019-11-29 NOTE — Telephone Encounter (Signed)
Medication Samples have been provided to the patient.  Drug name: Morrie Sheldon   Strength: 11mg    Qty: 1 LOT: [id number]  Exp.Date: 03/2021  Dosing instructions: Take 1 tablet by mouth daily.   The patient has been instructed regarding the correct time, dose, and frequency of taking this medication, including desired effects and most common side effects.   Earnestine Mealing 3:19 PM 11/29/2019

## 2019-12-01 ENCOUNTER — Other Ambulatory Visit: Payer: Self-pay | Admitting: Obstetrics and Gynecology

## 2019-12-01 DIAGNOSIS — Z1231 Encounter for screening mammogram for malignant neoplasm of breast: Secondary | ICD-10-CM

## 2019-12-21 DIAGNOSIS — M1712 Unilateral primary osteoarthritis, left knee: Secondary | ICD-10-CM | POA: Diagnosis not present

## 2019-12-21 DIAGNOSIS — M1711 Unilateral primary osteoarthritis, right knee: Secondary | ICD-10-CM | POA: Diagnosis not present

## 2019-12-29 NOTE — Progress Notes (Signed)
Virtual Visit via Video Note  I connected with Tonya Strickland on 01/03/20 at  9:00 AM EST by a video enabled telemedicine application and verified that I am speaking with the correct person using two identifiers.  Location: Patient: Home Provider: Clinic  This service was conducted via virtual visit.  Both audio and visual tools were used.  The patient was located at home. I was located in my office.  Consent was obtained prior to the virtual visit and is aware of possible charges through their insurance for this visit.  The patient is an established patient.  Dr. Estanislado Pandy, MD conducted the virtual visit and Hazel Sams, PA-C acted as scribe during the service.  Office staff helped with scheduling follow up visits after the service was conducted.     I discussed the limitations of evaluation and management by telemedicine and the availability of in person appointments. The patient expressed understanding and agreed to proceed.  CC: Pain in both knee joints  History of Present Illness: Tonya Strickland is a 59 y.o. female with history of seronegative rheumatoid arthritis, osteoarthritis, and DDD.  She is taking Xeljanz 11 mg XR daily.  She denies any recent rheumatoid arthritis flares.  She has been experiencing increased pain in both knee joints.  She denies any joint swelling. She had recent cortisone injections at Dr. Damita Dunnings office, which were effective.  She had occasional discomfort in both hands.  She denies any elbow joint pain or inflammation. She denies any trochanteric bursitis discomfort currently. She experiences generalized joint stiffness in the evening as opposed to the morning.   Review of Systems  Constitutional: Negative for fever and malaise/fatigue.  HENT: Negative for congestion.   Eyes: Negative for photophobia, pain, discharge and redness.  Respiratory: Negative for cough, shortness of breath and wheezing.   Cardiovascular: Negative for chest pain, palpitations and leg  swelling.  Gastrointestinal: Negative for blood in stool, constipation and diarrhea.  Genitourinary: Negative for dysuria and urgency.  Musculoskeletal: Positive for joint pain. Negative for back pain, myalgias and neck pain.  Skin: Negative for rash.  Neurological: Negative for dizziness, weakness and headaches.  Endo/Heme/Allergies: Does not bruise/bleed easily.  Psychiatric/Behavioral: Negative for depression and memory loss. The patient is not nervous/anxious and does not have insomnia.      Observations/Objective:  Physical Exam  Constitutional: She is oriented to person, place, and time and well-developed, well-nourished, and in no distress.  HENT:  Head: Normocephalic and atraumatic.  Eyes: Conjunctivae are normal.  Pulmonary/Chest: Effort normal.  Neurological: She is alert and oriented to person, place, and time.  Psychiatric: Mood, memory, affect and judgment normal.     Patient reports morning stiffness for 0 NONE.   Patient reports nocturnal pain.  Difficulty dressing/grooming: Denies Difficulty climbing stairs: Denies Difficulty getting out of chair: Denies Difficulty using hands for taps, buttons, cutlery, and/or writing: Reports  Assessment and Plan: Visit Diagnoses: Seronegative rheumatoid arthritis ([place]): She has not had any recent rheumatoid arthritis flares. She has limited ROM of the right wrist joint and unchanged elbow joint contractures.  She is not having any joint inflammation or morning stiffness at this time.  She has been experiencing pain in both knee joints but no warmth or effusions.  She had recent cortisone injections in both knee joints performed by Dr. Mayer Camel at [facility], which alleviated her discomfort.  She is overall clinically doing well on Xeljanz 11 mg XR daily. She will continue on this current regimen.  She will be moving  to [place] in 2 weeks, so she will be establishing care in [place] and will need her records transferred.   She will follow up with [place] as needed in the meantime.   High risk medication use - Xeljanz XR 11 mg 1 tablet daily.  Last TB gold negative on 07/01/2019 and will monitor yearly.  CBC and CMP were drawn on 10/04/19.  She is due to update lab work.  Standing orders were placed today.  Future order for lipid panel placed today.    Contracture of joint of both elbows:  Stable and unchanged bilateral elbow joint contractures.   Primary osteoarthritis of both knees: She has been experiencing increased pain in both knee joints. She was recently evaluated by Dr. Turner Daniels and had cortisone injections in both knee joints, which has alleviated some discomfort. She previously had Synvisc injections and will be establishing care at an orthopedic office in [place] once she moves. She was encouraged to ride a stationary bike for exercise and continue to work on lower extremity muscle strengthening.    Trochanteric bursitis of both hips: Resolved. She has no discomfort at this time.   DDD (degenerative disc disease), lumbar: She has no lower back pain or stiffness at this time.   Other medical conditions are listed as follows:   History of hypertension  Irregular heart beat  Follow Up Instructions: She will follow up PRN. She will be transferring care to a practice in [place] once she moves in 2 weeks.    I discussed the assessment and treatment plan with the patient. The patient was provided an opportunity to ask questions and all were answered. The patient agreed with the plan and demonstrated an understanding of the instructions.   The patient was advised to call back or seek an in-person evaluation if the symptoms worsen or if the condition fails to improve as anticipated.  I provided 25 minutes of non-face-to-face time during this encounter.  Pollyann Savoy, MD    Scribed by-  Sherron Ales, PA-C

## 2020-01-03 ENCOUNTER — Telehealth (INDEPENDENT_AMBULATORY_CARE_PROVIDER_SITE_OTHER): Payer: BC Managed Care – PPO | Admitting: Rheumatology

## 2020-01-03 ENCOUNTER — Other Ambulatory Visit: Payer: Self-pay | Admitting: *Deleted

## 2020-01-03 ENCOUNTER — Telehealth: Payer: Self-pay | Admitting: Rheumatology

## 2020-01-03 ENCOUNTER — Encounter: Payer: Self-pay | Admitting: Rheumatology

## 2020-01-03 ENCOUNTER — Other Ambulatory Visit: Payer: Self-pay

## 2020-01-03 DIAGNOSIS — M24522 Contracture, left elbow: Secondary | ICD-10-CM

## 2020-01-03 DIAGNOSIS — I499 Cardiac arrhythmia, unspecified: Secondary | ICD-10-CM

## 2020-01-03 DIAGNOSIS — M24521 Contracture, right elbow: Secondary | ICD-10-CM | POA: Diagnosis not present

## 2020-01-03 DIAGNOSIS — Z79899 Other long term (current) drug therapy: Secondary | ICD-10-CM | POA: Diagnosis not present

## 2020-01-03 DIAGNOSIS — M170 Bilateral primary osteoarthritis of knee: Secondary | ICD-10-CM

## 2020-01-03 DIAGNOSIS — M5136 Other intervertebral disc degeneration, lumbar region: Secondary | ICD-10-CM

## 2020-01-03 DIAGNOSIS — M0600 Rheumatoid arthritis without rheumatoid factor, unspecified site: Secondary | ICD-10-CM

## 2020-01-03 DIAGNOSIS — M7062 Trochanteric bursitis, left hip: Secondary | ICD-10-CM

## 2020-01-03 DIAGNOSIS — M7061 Trochanteric bursitis, right hip: Secondary | ICD-10-CM

## 2020-01-03 DIAGNOSIS — Z8679 Personal history of other diseases of the circulatory system: Secondary | ICD-10-CM

## 2020-01-03 NOTE — Telephone Encounter (Signed)
Patient left a voicemail stating she had a virtual appointment with Dr. Corliss Skains this morning.  Patient states she is arranging for a Rheumatologist in [place] for when she moves and Dr. Corliss Skains recommended Dr. Jacquenette Shone.  Patient states she called their office and they need a referral with [facility].  Please fax to their office #[phone]

## 2020-01-03 NOTE — Telephone Encounter (Signed)
Referral, notes and labs faxed, pending appt

## 2020-01-04 ENCOUNTER — Telehealth: Payer: Self-pay | Admitting: Rheumatology

## 2020-01-04 ENCOUNTER — Other Ambulatory Visit: Payer: Self-pay | Admitting: *Deleted

## 2020-01-04 DIAGNOSIS — Z79899 Other long term (current) drug therapy: Secondary | ICD-10-CM

## 2020-01-04 NOTE — Telephone Encounter (Signed)
Lab Orders released.  

## 2020-01-04 NOTE — Telephone Encounter (Signed)
Patient had labs drawn at [facility] at 7:15am. Please release orders.

## 2020-01-05 ENCOUNTER — Telehealth: Payer: Self-pay | Admitting: *Deleted

## 2020-01-05 ENCOUNTER — Other Ambulatory Visit: Payer: Self-pay | Admitting: Rheumatology

## 2020-01-05 DIAGNOSIS — D638 Anemia in other chronic diseases classified elsewhere: Secondary | ICD-10-CM

## 2020-01-05 LAB — COMPLETE METABOLIC PANEL WITH GFR
AG Ratio: 1.8 (calc) (ref 1.0–2.5)
ALT: 19 U/L (ref 6–29)
AST: 17 U/L (ref 10–35)
Albumin: 4.2 g/dL (ref 3.6–5.1)
Alkaline phosphatase (APISO): 32 U/L — ABNORMAL LOW (ref 37–153)
BUN: 16 mg/dL (ref 7–25)
CO2: 25 mmol/L (ref 20–32)
Calcium: 9.5 mg/dL (ref 8.6–10.4)
Chloride: 100 mmol/L (ref 98–110)
Creat: 0.83 mg/dL (ref 0.50–1.05)
GFR, Est African American: 91 mL/min/{1.73_m2} (ref >=60)
GFR, Est Non African American: 79 mL/min/{1.73_m2} (ref >=60)
Globulin: 2.4 g/dL (calc) (ref 1.9–3.7)
Glucose, Bld: 89 mg/dL (ref 65–99)
Potassium: 4.6 mmol/L (ref 3.5–5.3)
Sodium: 137 mmol/L (ref 135–146)
Total Bilirubin: 0.5 mg/dL (ref 0.2–1.2)
Total Protein: 6.6 g/dL (ref 6.1–8.1)

## 2020-01-05 LAB — CBC WITH DIFFERENTIAL/PLATELET
Absolute Monocytes: 311 cells/uL (ref 200–950)
Basophils Absolute: 31 cells/uL (ref 0–200)
Basophils Relative: 0.5 %
Eosinophils Absolute: 61 cells/uL (ref 15–500)
Eosinophils Relative: 1 %
HCT: 30.4 % — ABNORMAL LOW (ref 35.0–45.0)
Hemoglobin: 9.9 g/dL — ABNORMAL LOW (ref 11.7–15.5)
Lymphs Abs: 2367 cells/uL (ref 850–3900)
MCH: 29.6 pg (ref 27.0–33.0)
MCHC: 32.6 g/dL (ref 32.0–36.0)
MCV: 90.7 fL (ref 80.0–100.0)
MPV: 12.2 fL (ref 7.5–12.5)
Monocytes Relative: 5.1 %
Neutro Abs: 3331 cells/uL (ref 1500–7800)
Neutrophils Relative %: 54.6 %
Platelets: 220 10*3/uL (ref 140–400)
RBC: 3.35 10*6/uL — ABNORMAL LOW (ref 3.80–5.10)
RDW: 13.2 % (ref 11.0–15.0)
Total Lymphocyte: 38.8 %
WBC: 6.1 10*3/uL (ref 3.8–10.8)

## 2020-01-05 LAB — LIPID PANEL
Cholesterol: 181 mg/dL (ref <200)
HDL: 71 mg/dL (ref >=50)
LDL Cholesterol (Calc): 84 mg/dL (calc)
Non-HDL Cholesterol (Calc): 110 mg/dL (calc) (ref <130)
Total CHOL/HDL Ratio: 2.5 (calc) (ref <5.0)
Triglycerides: 160 mg/dL — ABNORMAL HIGH (ref <150)

## 2020-01-05 MED ORDER — XELJANZ XR 11 MG PO TB24: 1.0000 | ORAL_TABLET | Freq: Every day | ORAL | 0 refills | Status: AC

## 2020-01-05 NOTE — Progress Notes (Signed)
Please advise 

## 2020-01-05 NOTE — Telephone Encounter (Signed)
-----   Message from Audrie Lia, RT sent at 01/03/2020 11:23 AM EST ----- Regarding: REFILL XELJANZ 90 DAY SUPPLY AFTER LABS UPDATED

## 2020-01-05 NOTE — Progress Notes (Signed)
I discussed labs with [facility] [place] today.  She is aware of elevated triglycerides.  A drop in hemoglobin was noted.  I am uncertain if it is the lab work.  She denies any history of bleeding or dark stools.  She will come in tomorrow for repeat hemoglobin and iron studies.  She has been taking Harriette Ohara for several months.  In association of dropping hemoglobin was Harriette Ohara was also discussed.

## 2020-01-06 ENCOUNTER — Other Ambulatory Visit: Payer: Self-pay | Admitting: *Deleted

## 2020-01-06 DIAGNOSIS — D638 Anemia in other chronic diseases classified elsewhere: Secondary | ICD-10-CM | POA: Diagnosis not present

## 2020-01-06 LAB — IRON,TIBC AND FERRITIN PANEL
%SAT: 18 % (calc) (ref 16–45)
Ferritin: 146 ng/mL (ref 16–232)
Iron: 82 ug/dL (ref 45–160)
TIBC: 450 mcg/dL (calc) (ref 250–450)

## 2020-01-06 LAB — HEMOGLOBIN: Hemoglobin: 12.3 g/dL (ref 11.7–15.5)

## 2020-01-07 NOTE — Progress Notes (Signed)
Hemoglobin is normal.  Please notify patient that the previous hemoglobin was most likely lab error.

## 2020-01-14 ENCOUNTER — Ambulatory Visit
Admission: RE | Admit: 2020-01-14 | Discharge: 2020-01-14 | Disposition: A | Discharge status: Home / Self Care | Payer: BC Managed Care – PPO | Source: Ambulatory Visit | Attending: Obstetrics and Gynecology | Admitting: Obstetrics and Gynecology

## 2020-01-14 ENCOUNTER — Other Ambulatory Visit: Payer: Self-pay

## 2020-01-14 DIAGNOSIS — Z1231 Encounter for screening mammogram for malignant neoplasm of breast: Secondary | ICD-10-CM

## 2020-01-28 DIAGNOSIS — Z758 Other problems related to medical facilities and other health care: Secondary | ICD-10-CM | POA: Diagnosis not present

## 2020-01-28 DIAGNOSIS — M0600 Rheumatoid arthritis without rheumatoid factor, unspecified site: Secondary | ICD-10-CM | POA: Diagnosis not present

## 2020-01-28 DIAGNOSIS — Z1159 Encounter for screening for other viral diseases: Secondary | ICD-10-CM | POA: Diagnosis not present

## 2020-03-06 DIAGNOSIS — Z6827 Body mass index (BMI) 27.0-27.9, adult: Secondary | ICD-10-CM | POA: Diagnosis not present

## 2020-03-06 DIAGNOSIS — Z01419 Encounter for gynecological examination (general) (routine) without abnormal findings: Secondary | ICD-10-CM | POA: Diagnosis not present

## 2020-03-06 DIAGNOSIS — Z23 Encounter for immunization: Secondary | ICD-10-CM | POA: Diagnosis not present

## 2020-03-06 DIAGNOSIS — N952 Postmenopausal atrophic vaginitis: Secondary | ICD-10-CM | POA: Diagnosis not present

## 2020-03-06 DIAGNOSIS — Z1151 Encounter for screening for human papillomavirus (HPV): Secondary | ICD-10-CM | POA: Diagnosis not present

## 2020-03-23 DIAGNOSIS — M0609 Rheumatoid arthritis without rheumatoid factor, multiple sites: Secondary | ICD-10-CM | POA: Diagnosis not present

## 2020-03-23 DIAGNOSIS — Z79899 Other long term (current) drug therapy: Secondary | ICD-10-CM | POA: Diagnosis not present

## 2020-03-23 DIAGNOSIS — Z9225 Personal history of immunosupression therapy: Secondary | ICD-10-CM | POA: Diagnosis not present

## 2020-03-23 DIAGNOSIS — M064 Inflammatory polyarthropathy: Secondary | ICD-10-CM | POA: Diagnosis not present

## 2020-03-24 DIAGNOSIS — E790 Hyperuricemia without signs of inflammatory arthritis and tophaceous disease: Secondary | ICD-10-CM | POA: Diagnosis not present

## 2020-03-24 DIAGNOSIS — Z808 Family history of malignant neoplasm of other organs or systems: Secondary | ICD-10-CM | POA: Diagnosis not present

## 2020-03-24 DIAGNOSIS — Z7689 Persons encountering health services in other specified circumstances: Secondary | ICD-10-CM | POA: Diagnosis not present

## 2020-03-24 DIAGNOSIS — Z1159 Encounter for screening for other viral diseases: Secondary | ICD-10-CM | POA: Diagnosis not present

## 2020-03-24 DIAGNOSIS — E538 Deficiency of other specified B group vitamins: Secondary | ICD-10-CM | POA: Diagnosis not present

## 2020-03-24 DIAGNOSIS — Z833 Family history of diabetes mellitus: Secondary | ICD-10-CM | POA: Diagnosis not present

## 2020-03-24 DIAGNOSIS — E785 Hyperlipidemia, unspecified: Secondary | ICD-10-CM | POA: Diagnosis not present

## 2020-03-24 DIAGNOSIS — Z124 Encounter for screening for malignant neoplasm of cervix: Secondary | ICD-10-CM | POA: Diagnosis not present

## 2020-03-24 DIAGNOSIS — Z1389 Encounter for screening for other disorder: Secondary | ICD-10-CM | POA: Diagnosis not present

## 2020-03-24 DIAGNOSIS — E559 Vitamin D deficiency, unspecified: Secondary | ICD-10-CM | POA: Diagnosis not present

## 2020-03-24 DIAGNOSIS — E668 Other obesity: Secondary | ICD-10-CM | POA: Diagnosis not present

## 2020-03-24 DIAGNOSIS — Z0000 Encounter for general adult medical examination without abnormal findings: Secondary | ICD-10-CM | POA: Diagnosis not present

## 2020-09-04 IMAGING — MG DIGITAL SCREENING BILATERAL MAMMOGRAM WITH TOMO AND CAD
8 series · 9 of 24 positions shown · non-contrast
Comparison: None.

CLINICAL DATA: Screening.

EXAM:
DIGITAL SCREENING BILATERAL MAMMOGRAM WITH TOMO AND CAD

[R CC synth-2D]
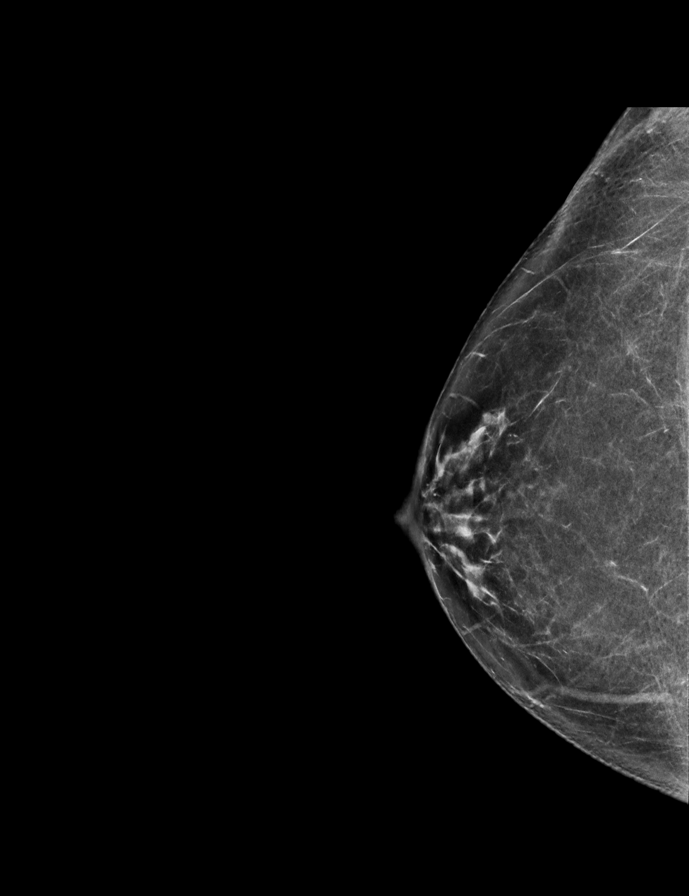

[L CC synth-2D]
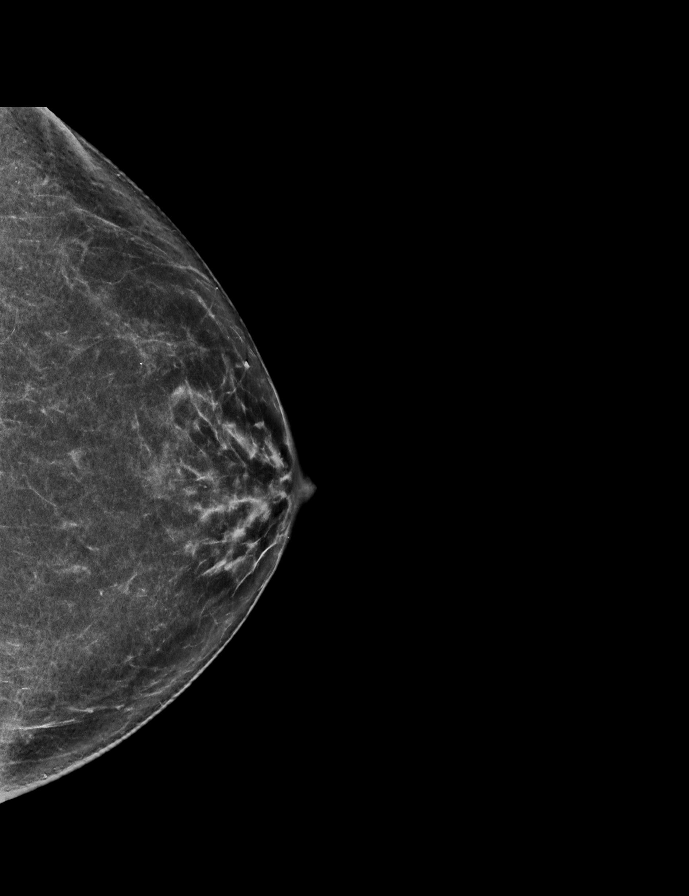

[R MLO synth-2D]
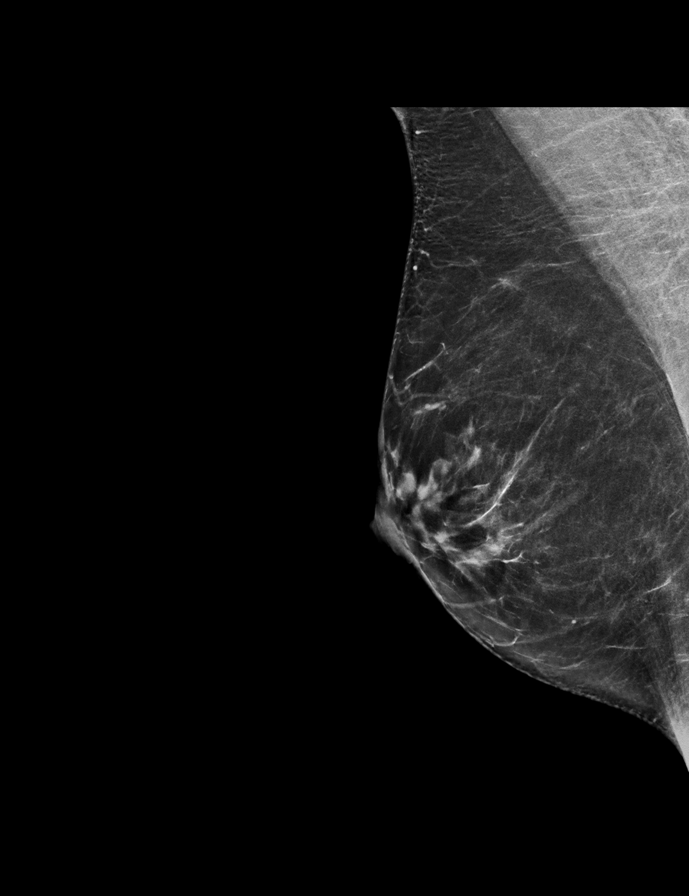

[L MLO synth-2D]
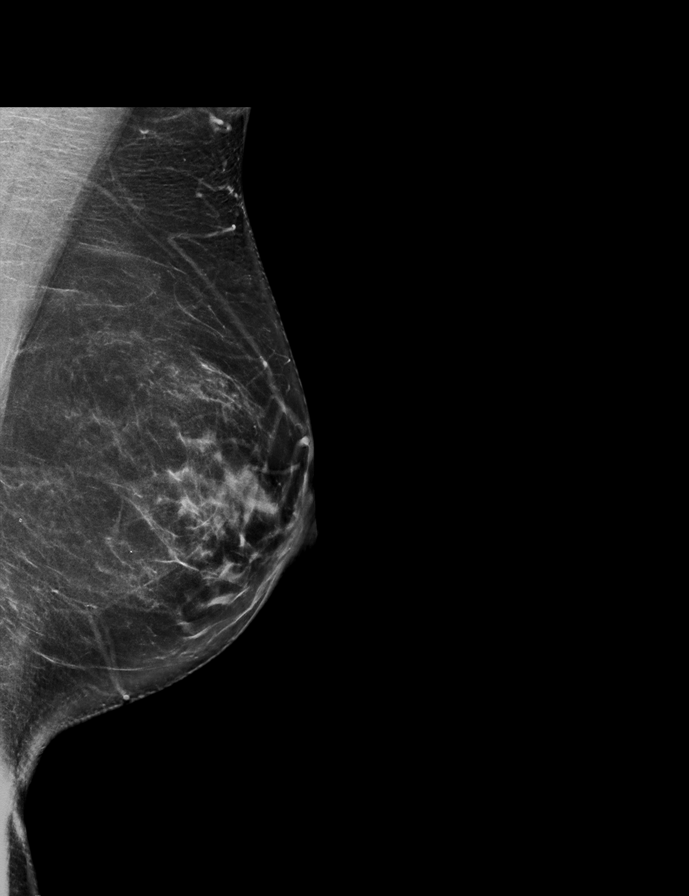

[L CC tomo · 2 of 65 frames shown]
[frame 21/65]
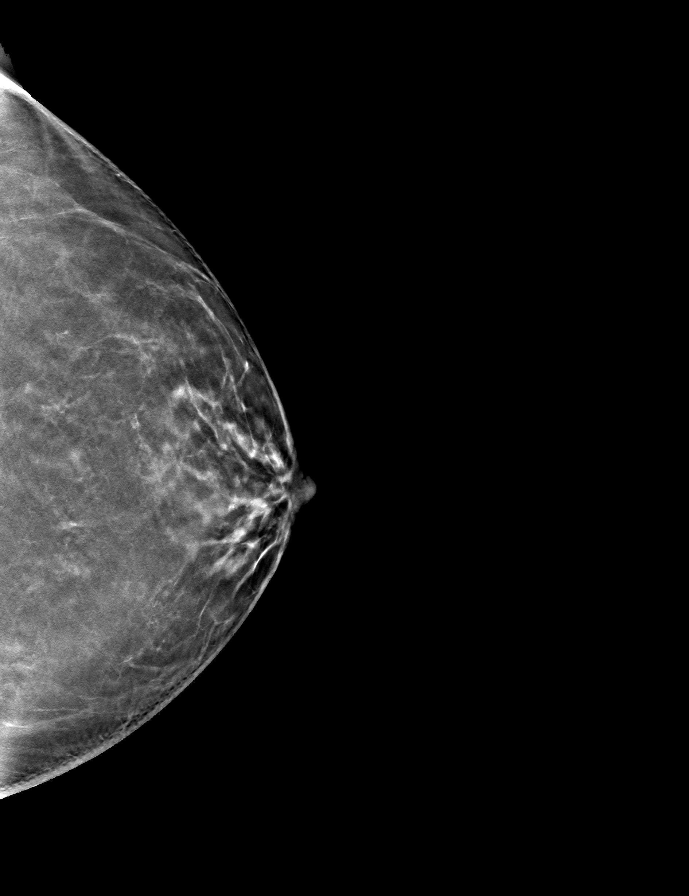
[frame 33/65]
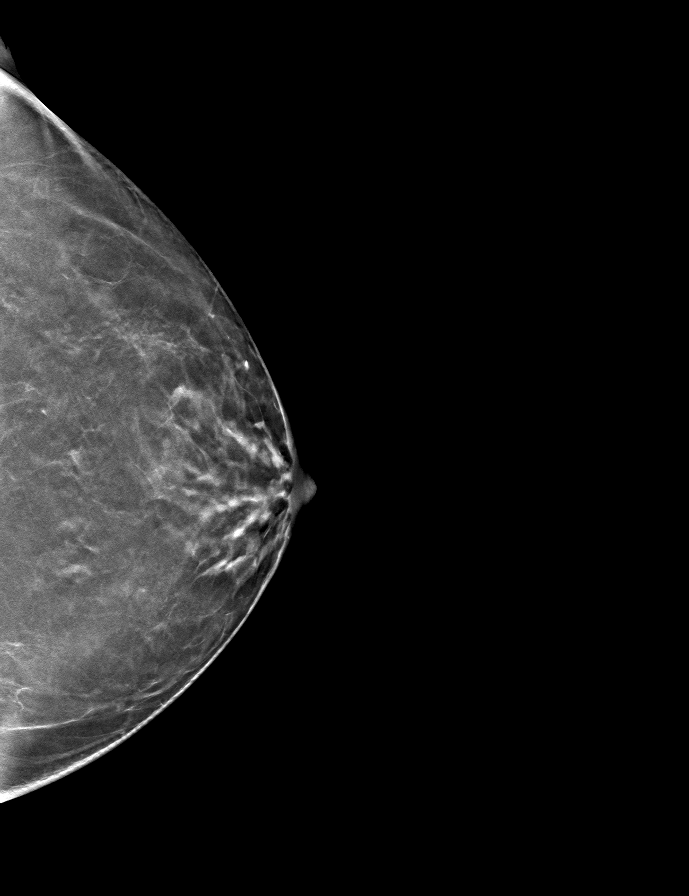

[L MLO tomo · tomo slice 37/74.0]
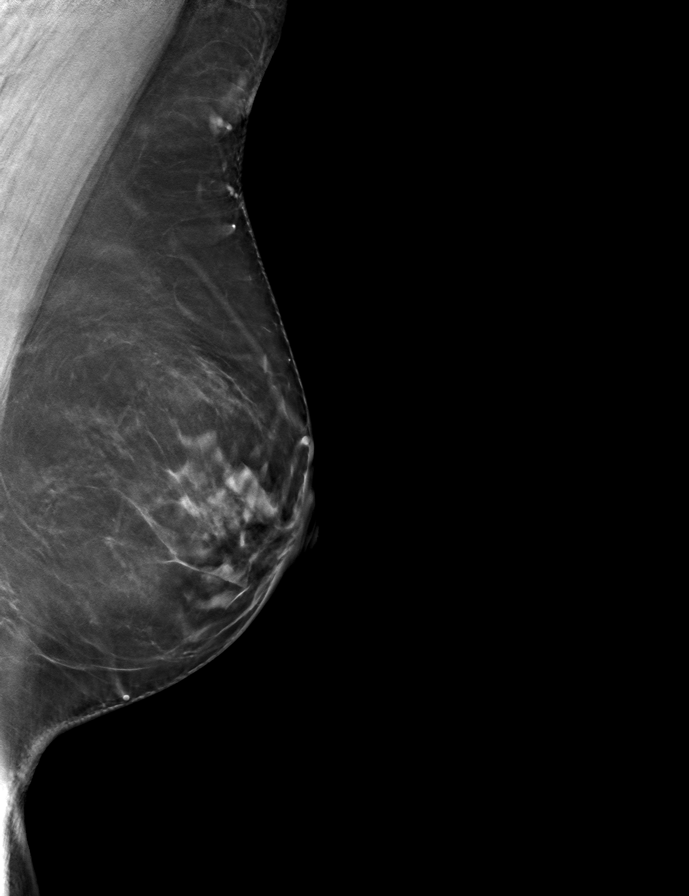

[R CC tomo · tomo slice 33/66.0]
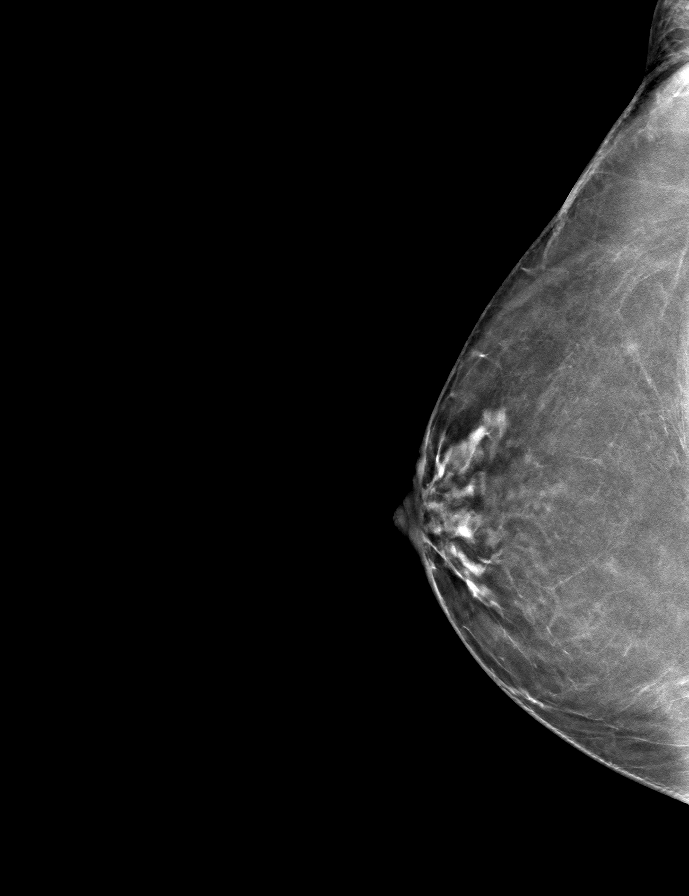

[R MLO tomo · tomo slice 35/70.0]
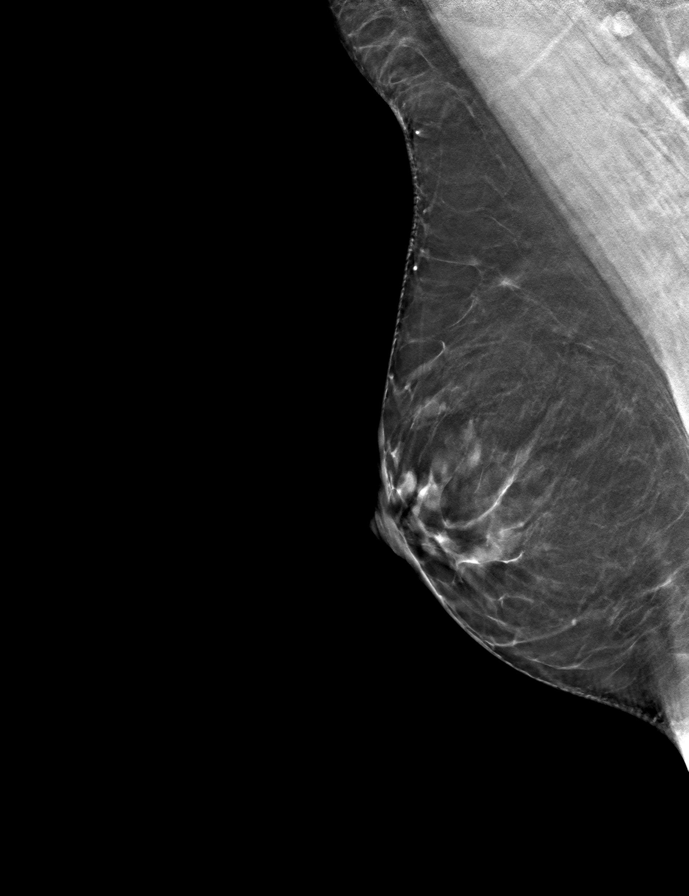

[9 of 24 positions shown; findings below may reference images not displayed]

ACR Breast Density Category b: There are scattered areas of
fibroglandular density.
FINDINGS: There are no findings suspicious for malignancy. Images were
processed with CAD.
IMPRESSION: No mammographic evidence of malignancy. A result letter of this
screening mammogram will be mailed directly to the patient.

RECOMMENDATION:
Screening mammogram in one year. (Code:Y5-G-EJ6)

BI-RADS CATEGORY  1: Negative.
